# Patient Record
Sex: Female | Born: 1935 | Race: White | Hispanic: No | Smoking: Never smoker
Health system: Southern US, Community
[De-identification: ages and names within clinical notes are randomized; demographics above are authoritative.]

## PROBLEM LIST (undated history)

## (undated) DIAGNOSIS — I639 Cerebral infarction, unspecified: Secondary | ICD-10-CM

## (undated) DIAGNOSIS — B019 Varicella without complication: Secondary | ICD-10-CM

## (undated) DIAGNOSIS — Z8669 Personal history of other diseases of the nervous system and sense organs: Secondary | ICD-10-CM

## (undated) DIAGNOSIS — T7840XA Allergy, unspecified, initial encounter: Secondary | ICD-10-CM

## (undated) DIAGNOSIS — C50919 Malignant neoplasm of unspecified site of unspecified female breast: Secondary | ICD-10-CM

## (undated) DIAGNOSIS — E079 Disorder of thyroid, unspecified: Secondary | ICD-10-CM

## (undated) DIAGNOSIS — F32A Depression, unspecified: Secondary | ICD-10-CM

## (undated) DIAGNOSIS — E785 Hyperlipidemia, unspecified: Secondary | ICD-10-CM

## (undated) DIAGNOSIS — C801 Malignant (primary) neoplasm, unspecified: Secondary | ICD-10-CM

## (undated) DIAGNOSIS — F329 Major depressive disorder, single episode, unspecified: Secondary | ICD-10-CM

## (undated) HISTORY — PX: MASTECTOMY: SHX3

## (undated) HISTORY — DX: Major depressive disorder, single episode, unspecified: F32.9

## (undated) HISTORY — PX: ABDOMINAL HYSTERECTOMY: SHX81

## (undated) HISTORY — DX: Personal history of other diseases of the nervous system and sense organs: Z86.69

## (undated) HISTORY — DX: Varicella without complication: B01.9

## (undated) HISTORY — DX: Cerebral infarction, unspecified: I63.9

## (undated) HISTORY — DX: Allergy, unspecified, initial encounter: T78.40XA

## (undated) HISTORY — DX: Depression, unspecified: F32.A

## (undated) HISTORY — PX: BREAST SURGERY: SHX581

## (undated) HISTORY — DX: Hyperlipidemia, unspecified: E78.5

## (undated) HISTORY — DX: Disorder of thyroid, unspecified: E07.9

## (undated) HISTORY — DX: Malignant (primary) neoplasm, unspecified: C80.1

## (undated) HISTORY — PX: SKIN CANCER DESTRUCTION: SHX778

---

## 1974-10-27 HISTORY — PX: HEMORRHOID SURGERY: SHX153

## 1985-10-27 HISTORY — PX: OTHER SURGICAL HISTORY: SHX169

## 1998-10-27 HISTORY — PX: OTHER SURGICAL HISTORY: SHX169

## 1999-12-24 ENCOUNTER — Encounter: Admission: RE | Admit: 1999-12-24 | Discharge: 1999-12-24 | Payer: Self-pay | Admitting: *Deleted

## 1999-12-24 ENCOUNTER — Encounter: Payer: Self-pay | Admitting: *Deleted

## 2000-03-17 ENCOUNTER — Other Ambulatory Visit: Admission: RE | Admit: 2000-03-17 | Discharge: 2000-03-17 | Payer: Self-pay | Admitting: Obstetrics and Gynecology

## 2000-12-29 ENCOUNTER — Encounter: Admission: RE | Admit: 2000-12-29 | Discharge: 2000-12-29 | Payer: Self-pay | Admitting: Internal Medicine

## 2000-12-29 ENCOUNTER — Encounter: Payer: Self-pay | Admitting: Internal Medicine

## 2002-01-06 ENCOUNTER — Encounter: Admission: RE | Admit: 2002-01-06 | Discharge: 2002-01-06 | Payer: Self-pay | Admitting: *Deleted

## 2002-01-06 ENCOUNTER — Encounter: Payer: Self-pay | Admitting: *Deleted

## 2003-01-18 ENCOUNTER — Encounter: Admission: RE | Admit: 2003-01-18 | Discharge: 2003-01-18 | Payer: Self-pay | Admitting: *Deleted

## 2003-01-18 ENCOUNTER — Encounter: Payer: Self-pay | Admitting: *Deleted

## 2004-01-23 ENCOUNTER — Encounter: Admission: RE | Admit: 2004-01-23 | Discharge: 2004-01-23 | Payer: Self-pay | Admitting: *Deleted

## 2005-01-23 ENCOUNTER — Encounter: Admission: RE | Admit: 2005-01-23 | Discharge: 2005-01-23 | Payer: Self-pay | Admitting: *Deleted

## 2006-01-30 ENCOUNTER — Encounter: Admission: RE | Admit: 2006-01-30 | Discharge: 2006-01-30 | Payer: Self-pay | Admitting: Internal Medicine

## 2007-02-12 ENCOUNTER — Encounter: Admission: RE | Admit: 2007-02-12 | Discharge: 2007-02-12 | Payer: Self-pay | Admitting: *Deleted

## 2010-11-17 ENCOUNTER — Encounter: Payer: Self-pay | Admitting: Internal Medicine

## 2011-03-07 ENCOUNTER — Ambulatory Visit: Payer: Medicare Other | Admitting: Internal Medicine

## 2011-03-14 ENCOUNTER — Ambulatory Visit (INDEPENDENT_AMBULATORY_CARE_PROVIDER_SITE_OTHER): Payer: Medicare Other | Admitting: Internal Medicine

## 2011-03-14 ENCOUNTER — Other Ambulatory Visit: Payer: Self-pay | Admitting: Internal Medicine

## 2011-03-14 DIAGNOSIS — Z1231 Encounter for screening mammogram for malignant neoplasm of breast: Secondary | ICD-10-CM

## 2011-03-14 DIAGNOSIS — F329 Major depressive disorder, single episode, unspecified: Secondary | ICD-10-CM

## 2011-03-14 DIAGNOSIS — E079 Disorder of thyroid, unspecified: Secondary | ICD-10-CM

## 2011-03-14 DIAGNOSIS — E785 Hyperlipidemia, unspecified: Secondary | ICD-10-CM

## 2011-03-17 ENCOUNTER — Encounter: Payer: Self-pay | Admitting: Internal Medicine

## 2011-03-17 DIAGNOSIS — F329 Major depressive disorder, single episode, unspecified: Secondary | ICD-10-CM | POA: Insufficient documentation

## 2011-03-17 DIAGNOSIS — E785 Hyperlipidemia, unspecified: Secondary | ICD-10-CM | POA: Insufficient documentation

## 2011-03-17 DIAGNOSIS — F32A Depression, unspecified: Secondary | ICD-10-CM | POA: Insufficient documentation

## 2011-03-17 DIAGNOSIS — E079 Disorder of thyroid, unspecified: Secondary | ICD-10-CM | POA: Insufficient documentation

## 2011-03-17 NOTE — Progress Notes (Signed)
Subjective:    Patient ID: Sara Forbes, female    DOB: 25-Jun-1932, 77 y.o.   MRN: [id number]  HPI Sara Forbes has recently returned to [place] after living the past several years in [place] and presents to establish for on-going continuity care. She has no specific complaints at today's visit and reports that she has been feeling well.   Past Medical History  Diagnosis Date  . Cancer     CIS right breast  . Varicella w/o complication   . Depression     chronic; never hospitalized, no h/o suicidal ideatino  . Allergy     hayfever  . Hyperlipidemia   . History of migraines     not a current problem  . Thyroid disease   . Hyperparathyroidism   . Stroke     '08 - affected vision - total recovery   Past Surgical History  Procedure Date  . Hemorrhoid surgery 1976  . Breast surgery     right mastectomy with reconstruction 1985  . Bunionectomy 2000    bilateral ([facility])  . Parathyroidectomy 1987    [facility]  . Skin cancer destruction 2008, 2012    face, scalp for squamous cell carcinoma  . Abdominal hysterectomy     fibroids 1978   Family History  Problem Relation Age of Onset  . Arthritis Mother   . Hypertension Mother   . Heart disease Father     CAD/MI  . Hyperlipidemia Father   . Hypertension Father   . Cancer Sister     lung cancer  . Diabetes Neg Hx   . Cancer Sister     breast cancer   History   Social History  . Marital Status: Married    Spouse Name: N/A    Number of Children: N/A  . Years of Education: 16   Occupational History  . homemaker    Social History Main Topics  . Smoking status: Never Smoker   . Smokeless tobacco: Never Used  . Alcohol Use: 3.5 oz/week    7 drink(s) per week  . Drug Use: No  . Sexually Active: Yes -- Female partner(s)   Other Topics Concern  . Not on file   Social History Narrative   [employer] ([employer]).  Married '54.  1 son - '59; 2 dtrs - '61, '71; 6 grandchildren, 1 ggc OTW. Taught school for a  short time. Marriage in good health. End of Life Care:  DNR, no long term intubation, no heroic or futile measures.        Review of Systems Review of Systems  Constitutional:  Negative for fever, chills, activity change and unexpected weight change.  HENT:  Negative for hearing loss, ear pain, congestion, neck stiffness and postnasal drip.   Eyes: Negative for pain, discharge and visual disturbance.  Respiratory: Negative for chest tightness and wheezing.   Cardiovascular: Negative for chest pain and palpitations.       [No decreased exercise tolerance Gastrointestinal: [No change in bowel habit. No bloating or gas. No reflux or indigestion Genitourinary: Negative for urgency, frequency, flank pain and difficulty urinating.  Musculoskeletal: Negative for myalgias, back pain, arthralgias and gait problem.  Neurological: Negative for dizziness, tremors, weakness and headaches.  Hematological: Negative for adenopathy.  Psychiatric/Behavioral: Negative for behavioral problems and dysphoric mood.       Objective:   Physical Exam Vitals reviewed Gen'l: WNWD WW in no distress HEENT - C&S clear, oropharynx without lesions or abnormality Neck - supple,  virtually invisible surgical scar Lungs - normal respiratory function Cor - RRR wihtout murmurs or gallops Abdomen - BS + Extremities - no deformity, no edema Neuro - A&O x 3, CN II-XII grossly intact.        Assessment & Plan:  1. Hypertension - well controlled. She reports that she has had recent labs  Plan- continue present medications.  2. Hyperlipidemia - stable on present medication. She has had recent labs.  3. Depression - has been on zoloft for a long time. She does well with medication and wishes to continue.  4. Health maintenance - will review old records with recommendations to follow.  In summary - a very nice woman who appears medically stable at this time. She will return for follow-up in the next several  months.

## 2011-03-18 ENCOUNTER — Ambulatory Visit
Admission: RE | Admit: 2011-03-18 | Discharge: 2011-03-18 | Disposition: A | Discharge status: Home / Self Care | Payer: Medicare Other | Source: Ambulatory Visit | Attending: Internal Medicine | Admitting: Internal Medicine

## 2011-03-18 DIAGNOSIS — Z1231 Encounter for screening mammogram for malignant neoplasm of breast: Secondary | ICD-10-CM

## 2011-03-21 ENCOUNTER — Other Ambulatory Visit: Payer: Self-pay | Admitting: *Deleted

## 2011-03-21 MED ORDER — LEVOTHYROXINE SODIUM 88 MCG PO TABS: 88.0000 ug | ORAL_TABLET | Freq: Every day | ORAL | Status: DC

## 2011-03-21 MED ORDER — SIMVASTATIN 40 MG PO TABS: 40.0000 mg | ORAL_TABLET | Freq: Every day | ORAL | Status: DC

## 2011-03-21 MED ORDER — SERTRALINE HCL 50 MG PO TABS: 50.0000 mg | ORAL_TABLET | Freq: Every day | ORAL | Status: DC

## 2011-03-21 MED ORDER — TELMISARTAN-HCTZ 80-12.5 MG PO TABS: 1.0000 | ORAL_TABLET | Freq: Every day | ORAL | Status: DC

## 2011-03-21 MED ORDER — ATENOLOL 50 MG PO TABS: 50.0000 mg | ORAL_TABLET | Freq: Two times a day (BID) | ORAL | Status: DC

## 2011-05-20 ENCOUNTER — Encounter: Payer: Self-pay | Admitting: Internal Medicine

## 2011-05-20 ENCOUNTER — Ambulatory Visit (INDEPENDENT_AMBULATORY_CARE_PROVIDER_SITE_OTHER): Payer: Medicare Other | Admitting: Internal Medicine

## 2011-05-20 VITALS — BP 138/68 | HR 58 | Temp 98.8°F | Ht 64.0 in | Wt 141.0 lb

## 2011-05-20 DIAGNOSIS — M5430 Sciatica, unspecified side: Secondary | ICD-10-CM

## 2011-05-20 DIAGNOSIS — M5432 Sciatica, left side: Secondary | ICD-10-CM

## 2011-05-20 MED ORDER — PREDNISONE 10 MG PO TABS: ORAL_TABLET | ORAL | Status: DC

## 2011-05-20 NOTE — Progress Notes (Signed)
Subjective:    Patient ID: Sara Forbes, female    DOB: 01/08/1932, 77 y.o.   MRN: [id number]  HPI  Pt of Dr Debby Bud , here for acute visit,  with c/o signficant 2-3 days episode of left LBP, mild to mod, similar per pt to prior 2 episodes, first occurred 15 yrs ago, recurred 3 yrs ago, tx first time with prednisone, second time with celebrex and improved both times, now with dull and burning quality intermittent at rest and with standing with intermittent radiation to the left buttock and more distal leg laterally to just below the knee, with some numbness occasionaly, no bowel or bladder change, fever, fall.  Only has weakness and "dragging the leg" with ambulation after walking more than about 20 min on the treadmill.  No recent MRI  Also due for right tear duct surgury Friday later this wk, and asked to not take ASA or nsaid prior.  Had dental surgury 2 wks ago to right upper jaw and curretnly still taking doxycyline (almost through). Past Medical History  Diagnosis Date  . Cancer     CIS right breast  . Varicella w/o complication   . Depression     chronic; never hospitalized, no h/o suicidal ideatino  . Allergy     hayfever  . Hyperlipidemia   . History of migraines     not a current problem  . Thyroid disease   . Hyperparathyroidism   . Stroke     '08 - affected vision - total recovery   Past Surgical History  Procedure Date  . Hemorrhoid surgery 1976  . Breast surgery     right mastectomy with reconstruction 1985  . Bunionectomy 2000    bilateral ([facility])  . Parathyroidectomy 1987    [facility]  . Skin cancer destruction 2008, 2012    face, scalp for squamous cell carcinoma  . Abdominal hysterectomy     fibroids 1978    reports that she has never smoked. She has never used smokeless tobacco. She reports that she drinks about 3.5 ounces of alcohol per week. She reports that she does not use illicit drugs. family history includes Arthritis in her mother; Cancer in her  sisters; Heart disease in her father; Hyperlipidemia in her father; and Hypertension in her father and mother.  There is no history of Diabetes. No Known Allergies Current Outpatient Prescriptions on File Prior to Visit  Medication Sig Dispense Refill  . atenolol (TENORMIN) 50 MG tablet Take 1 tablet (50 mg total) by mouth 2 (two) times daily.  180 tablet  3  . ibuprofen (ADVIL,MOTRIN) 100 MG tablet Take 100 mg by mouth 2 (two) times daily.        Marland Kitchen levothyroxine (SYNTHROID, LEVOTHROID) 88 MCG tablet Take 1 tablet (88 mcg total) by mouth daily.  90 tablet  3  . MULTIPLE VITAMIN PO Take by mouth.        . senna (SENOKOT) 8.6 MG tablet Take 1 tablet by mouth at bedtime.        . sertraline (ZOLOFT) 50 MG tablet Take 1 tablet (50 mg total) by mouth daily.  90 tablet  3  . simvastatin (ZOCOR) 40 MG tablet Take 1 tablet (40 mg total) by mouth at bedtime. 1/2 tablet at bedtime  90 tablet  3  . telmisartan-hydrochlorothiazide (MICARDIS HCT) 80-12.5 MG per tablet Take 1 tablet by mouth daily.  90 tablet  3   Review of Systems Review of Systems  Constitutional: Negative  for diaphoresis and unexpected weight change.  HENT: Negative for drooling and tinnitus.   Eyes: Negative for photophobia and visual disturbance.  Respiratory: Negative for choking and stridor.   Gastrointestinal: Negative for vomiting and blood in stool.  Genitourinary: Negative for hematuria and decreased urine volume. .       Objective:   Physical Exam BP 138/68  Pulse 58  Temp(Src) 98.8 F (37.1 C) (Oral)  Ht 5\' 4"  (1.626 m)  Wt 141 lb (63.957 kg)  BMI 24.20 kg/m2  SpO2 96% Physical Exam  VS noted Constitutional: Pt appears well-developed and well-nourished.  HENT: Head: Normocephalic.  Right Ear: External ear normal.  Left Ear: External ear normal.  Eyes: Conjunctivae and EOM are normal. Pupils are equal, round, and reactive to light.  Neck: Normal range of motion. Neck supple.  Cardiovascular: Normal rate and  regular rhythm.   Pulmonary/Chest: Effort normal and breath sounds normal.  Abd:  Soft, NT, non-distended, + BS Neurological: Pt is alert. No cranial nerve deficit. Motor/dtr/gait intact, neg slr on left  Skin: Skin is warm. No erythema.  Psychiatric: Pt behavior is normal. Thought content normal.  Spine nontender, has some left mild paravertebral tender without erythema, rash, swelling       Assessment & Plan:

## 2011-05-20 NOTE — Assessment & Plan Note (Addendum)
Exam benign, but increased symptoms mostly with walking > 20 min on the treadmill recently and occas at rest as well similar to 2 episodes on the past recent 2 -3 wks;  Will tx with predpack, pt declines pain medication, and  to f/u any worsening symptoms or concerns, to avoid nsaids with the prednsisone, and any vigorous stretching excercises or golf until improved, consider MRI if persists or worsens; also advised to hold on tear duct surgury due to prednisone use at this time, though the risk would be small increased

## 2011-05-20 NOTE — Patient Instructions (Addendum)
Take all new medications as prescribed Continue all other medications as before Please avoid stretching excercises and golf until prednisone is completed Please hold on the elective right tear duct surgury until the prednisone is completed, unless otherwise advised per opthamology

## 2011-09-10 ENCOUNTER — Other Ambulatory Visit: Payer: Self-pay | Admitting: Internal Medicine

## 2012-01-05 ENCOUNTER — Other Ambulatory Visit (INDEPENDENT_AMBULATORY_CARE_PROVIDER_SITE_OTHER): Payer: Medicare Other

## 2012-01-05 ENCOUNTER — Ambulatory Visit (INDEPENDENT_AMBULATORY_CARE_PROVIDER_SITE_OTHER): Payer: Medicare Other | Admitting: Internal Medicine

## 2012-01-05 ENCOUNTER — Encounter: Payer: Self-pay | Admitting: Internal Medicine

## 2012-01-05 DIAGNOSIS — E079 Disorder of thyroid, unspecified: Secondary | ICD-10-CM

## 2012-01-05 DIAGNOSIS — E785 Hyperlipidemia, unspecified: Secondary | ICD-10-CM

## 2012-01-05 DIAGNOSIS — M5432 Sciatica, left side: Secondary | ICD-10-CM

## 2012-01-05 DIAGNOSIS — M5430 Sciatica, unspecified side: Secondary | ICD-10-CM

## 2012-01-05 DIAGNOSIS — M899 Disorder of bone, unspecified: Secondary | ICD-10-CM

## 2012-01-05 DIAGNOSIS — M8580 Other specified disorders of bone density and structure, unspecified site: Secondary | ICD-10-CM | POA: Insufficient documentation

## 2012-01-05 LAB — COMPREHENSIVE METABOLIC PANEL
ALT: 22 U/L (ref 0–35)
AST: 23 U/L (ref 0–37)
Albumin: 4.1 g/dL (ref 3.5–5.2)
Calcium: 9.5 mg/dL (ref 8.4–10.5)
Chloride: 103 mEq/L (ref 96–112)
Potassium: 4.6 mEq/L (ref 3.5–5.1)
Sodium: 139 mEq/L (ref 135–145)
Total Protein: 6.8 g/dL (ref 6.0–8.3)

## 2012-01-05 LAB — HEPATIC FUNCTION PANEL
ALT: 22 U/L (ref 0–35)
AST: 23 U/L (ref 0–37)
Total Protein: 6.8 g/dL (ref 6.0–8.3)

## 2012-01-05 LAB — LIPID PANEL
Total CHOL/HDL Ratio: 3
VLDL: 53.2 mg/dL — ABNORMAL HIGH (ref 0.0–40.0)

## 2012-01-05 LAB — LDL CHOLESTEROL, DIRECT: Direct LDL: 112.4 mg/dL

## 2012-01-05 MED ORDER — MELOXICAM 15 MG PO TABS: 15.0000 mg | ORAL_TABLET | Freq: Every day | ORAL | Status: AC

## 2012-01-05 NOTE — Assessment & Plan Note (Signed)
Patient with report of osteopenia  Plan - calcium 1200 mg (diet + supplement)           Vitamin D 1,000 iu daily            Obtain outside DXA report

## 2012-01-05 NOTE — Assessment & Plan Note (Signed)
Lab [facility] reveals adequate control with LDL below goal of 130 or less. Liver functions are normal. She is tolerating medication well  Plan - repeat lab in 1 year

## 2012-01-05 NOTE — Assessment & Plan Note (Signed)
Back exam w/o radicular findings - suspect muscular-skeletal back strain. No indication for imaging.  Plan- conservative treatment with Meloxicam 15 mg once a day - GI precautions given; not to use other NSAIDs.           Curtail straining activities until better = golf holiday           Daily back exercises.

## 2012-01-05 NOTE — Assessment & Plan Note (Signed)
Lab Results  Component Value Date   TSH 1.44 01/05/2012   Good control on present medications - continue the same.

## 2012-01-05 NOTE — Progress Notes (Signed)
Subjective:    Patient ID: Sara Forbes, female    DOB: January 26, 1932, 78 y.o.   MRN: [id number]  HPI Sara Forbes presents for evaluation of back pain. This is a recurrent intermittent problem. She had L-S spine films in [place] and supposedly had a bulging disc. She has been treated with prednisone in the past. She has had a flare of pain as well as having mild pain that radiates to the left leg. She has been able to do all her usual activities including a stretch flex class. No change in bowels, no incontinence.  She is asking for labs for routine follow -up of lipids and thyroid  She has been having nocturnal reflux/heartburn that respondes to otc H2 blocker therapy.  Past Medical History  Diagnosis Date  . Cancer     CIS right breast  . Varicella w/o complication   . Depression     chronic; never hospitalized, no h/o suicidal ideatino  . Allergy     hayfever  . Hyperlipidemia   . History of migraines     not a current problem  . Thyroid disease   . Hyperparathyroidism   . Stroke     '08 - affected vision - total recovery   Past Surgical History  Procedure Date  . Hemorrhoid surgery 1976  . Breast surgery     right mastectomy with reconstruction 1985  . Bunionectomy 2000    bilateral ([facility])  . Parathyroidectomy 1987    [facility]  . Skin cancer destruction 2008, 2012    face, scalp for squamous cell carcinoma  . Abdominal hysterectomy     fibroids 1978   Family History  Problem Relation Age of Onset  . Arthritis Mother   . Hypertension Mother   . Heart disease Father     CAD/MI  . Hyperlipidemia Father   . Hypertension Father   . Cancer Sister     lung cancer  . Diabetes Neg Hx   . Cancer Sister     breast cancer   History   Social History  . Marital Status: Married    Spouse Name: N/A    Number of Children: N/A  . Years of Education: 16   Occupational History  . homemaker    Social History Main Topics  . Smoking status: Never Smoker   .  Smokeless tobacco: Never Used  . Alcohol Use: 3.5 oz/week    7 drink(s) per week  . Drug Use: No  . Sexually Active: Yes -- Female partner(s)   Other Topics Concern  . Not on file   Social History Narrative   [employer] ([employer]).  Married '54.  1 son - '59; 2 dtrs - '61, '71; 6 grandchildren, 1 ggc OTW. Taught school for a short time. Marriage in good health. End of Life Care:  DNR, no long term intubation, no heroic or futile measures.       Review of Systems System review is negative for any constitutional, cardiac, pulmonary, GI or neuro symptoms or complaints other than as described in the HPI.     Objective:   Physical Exam Filed Vitals:   01/05/12 0926  BP: 116/62  Pulse: 66  Temp: 98.3 F (36.8 C)  Resp: 14   Gen'l- wnwd developed well groomed woman in no distress HEENT- C&S clear Cor - RRR Pulm - normal respirations. Back exam: normal stand; normal flex to greater than 100 degrees; normal gait; normal toe/heel walk; normal step up to exam  table; normal SLR sitting; normal DTRs at the patellar tendons; normal sensation to light touch, pin-prick and deep vibratory stimulus; no  CVA tenderness; able to move supine to sitting witout assistance.  Lab Results  Component Value Date   GLUCOSE 93 01/05/2012   CHOL 216* 01/05/2012   TRIG 266.0* 01/05/2012   HDL 65.00 01/05/2012   LDLDIRECT 112.4 01/05/2012        ALT 22 01/05/2012   AST 23 01/05/2012        NA 139 01/05/2012   K 4.6 01/05/2012   CL 103 01/05/2012   CREATININE 0.8 01/05/2012   BUN 25* 01/05/2012   CO2 30 01/05/2012   TSH 1.44 01/05/2012         Assessment & Plan:

## 2012-01-05 NOTE — Patient Instructions (Signed)
Back exam reveals no evidence of a pinched nerve. You may have low back pain that is muscular and you may have mild inflammation that is giving you sciatica type symptoms.   Plan - low back exercises            meloxicam 15 mg - take once a day. Do not take any other NSAIDs, e.g. advil            Vacation from golf until back pain is better.  For reflux  At night - don't eat large meals late; it is ok to take over the counter medications like pepcid.  For osteoporosis 1200 mg calcium, 1000 international units of Vit D.    Sciatica Sciatica is a weakness and/or changes in sensation (tingling, jolts, hot and cold, numbness) along the path the sciatic nerve travels. Irritation or damage to lumbar nerve roots is often also referred to as lumbar radiculopathy.   Lumbar radiculopathy (Sciatica) is the most common form of this problem. Radiculopathy can occur in any of the nerves coming out of the spinal cord. The problems caused depend on which nerves are involved. The sciatic nerve is the large nerve supplying the branches of nerves going from the hip to the toes. It often causes a numbness or weakness in the skin and/or muscles that the sciatic nerve serves. It also may cause symptoms (problems) of pain, burning, tingling, or electric shock-like feelings in the path of this nerve. This usually comes from injury to the fibers that make up the sciatic nerve. Some of these symptoms are low back pain and/or unpleasant feelings in the following areas:  From the mid-buttock down the back of the leg to the back of the knee.   And/or the outside of the calf and top of the foot.   And/or behind the inner ankle to the sole of the foot.  CAUSES    Herniated or slipped disc. Discs are the little cushions between the bones in the back.   Pressure by the piriformis muscle in the buttock on the sciatic nerve (Piriformis Syndrome).   Misalignment of the bones in the lower back and buttocks (Sacroiliac Joint  Derangement).   Narrowing of the spinal canal that puts pressure on or pinches the fibers that make up the sciatic nerve.   A slipped vertebra that is out of line with those above or beneath it.   Abnormality of the nervous system itself so that nerve fibers do not transmit signals properly, especially to feet and calves (neuropathy).   Tumor (this is rare).  Your caregiver can usually determine the cause of your sciatica and begin the treatment most likely to help you. TREATMENT   Taking over-the-counter painkillers, physical therapy, rest, exercise, spinal manipulation, and injections of anesthetics and/or steroids may be used. Surgery, acupuncture, and Yoga can also be effective. Mind over matter techniques, mental imagery, and changing factors such as your bed, chair, desk height, posture, and activities are other treatments that may be helpful. You and your caregiver can help determine what is best for you. With proper diagnosis, the cause of most sciatica can be identified and removed. Communication and cooperation between your caregiver and you is essential. If you are not successful immediately, do not be discouraged. With time, a proper treatment can be found that will make you comfortable. HOME CARE INSTRUCTIONS    If the pain is coming from a problem in the back, applying ice to that area for 15 to 20 minutes,  3 to 4 times per day while awake, may be helpful. Put the ice in a plastic bag. Place a towel between the bag of ice and your skin.   You may exercise or perform your usual activities if these do not aggravate your pain, or as suggested by your caregiver.   Only take over-the-counter or prescription medicines for pain, discomfort, or fever as directed by your caregiver.   If your caregiver has given you a follow-up appointment, it is very important to keep that appointment. Not keeping the appointment could result in a chronic or permanent injury, pain, and disability. If there  is any problem keeping the appointment, you must call back to this facility for assistance.  SEEK IMMEDIATE MEDICAL CARE IF:    You experience loss of control of bowel or bladder.   You have increasing weakness in the trunk, buttocks, or legs.   There is numbness in any areas from the hip down to the toes.   You have difficulty walking or keeping your balance.   You have any of the above, with fever or forceful vomiting.  Document Released: 10/07/2001 Document Revised: 10/02/2011 Document Reviewed: 05/26/2008 [facility] Patient Information 2012 [place], [place].Sciatica Sciatica is a weakness and/or changes in sensation (tingling, jolts, hot and cold, numbness) along the path the sciatic nerve travels. Irritation or damage to lumbar nerve roots is often also referred to as lumbar radiculopathy.   Lumbar radiculopathy (Sciatica) is the most common form of this problem. Radiculopathy can occur in any of the nerves coming out of the spinal cord. The problems caused depend on which nerves are involved. The sciatic nerve is the large nerve supplying the branches of nerves going from the hip to the toes. It often causes a numbness or weakness in the skin and/or muscles that the sciatic nerve serves. It also may cause symptoms (problems) of pain, burning, tingling, or electric shock-like feelings in the path of this nerve. This usually comes from injury to the fibers that make up the sciatic nerve. Some of these symptoms are low back pain and/or unpleasant feelings in the following areas:  From the mid-buttock down the back of the leg to the back of the knee.   And/or the outside of the calf and top of the foot.   And/or behind the inner ankle to the sole of the foot.  CAUSES    Herniated or slipped disc. Discs are the little cushions between the bones in the back.   Pressure by the piriformis muscle in the buttock on the sciatic nerve (Piriformis Syndrome).   Misalignment of the bones in the lower  back and buttocks (Sacroiliac Joint Derangement).   Narrowing of the spinal canal that puts pressure on or pinches the fibers that make up the sciatic nerve.   A slipped vertebra that is out of line with those above or beneath it.   Abnormality of the nervous system itself so that nerve fibers do not transmit signals properly, especially to feet and calves (neuropathy).   Tumor (this is rare).  Your caregiver can usually determine the cause of your sciatica and begin the treatment most likely to help you. TREATMENT   Taking over-the-counter painkillers, physical therapy, rest, exercise, spinal manipulation, and injections of anesthetics and/or steroids may be used. Surgery, acupuncture, and Yoga can also be effective. Mind over matter techniques, mental imagery, and changing factors such as your bed, chair, desk height, posture, and activities are other treatments that may be helpful. You and  your caregiver can help determine what is best for you. With proper diagnosis, the cause of most sciatica can be identified and removed. Communication and cooperation between your caregiver and you is essential. If you are not successful immediately, do not be discouraged. With time, a proper treatment can be found that will make you comfortable. HOME CARE INSTRUCTIONS    If the pain is coming from a problem in the back, applying ice to that area for 15 to 20 minutes, 3 to 4 times per day while awake, may be helpful. Put the ice in a plastic bag. Place a towel between the bag of ice and your skin.   You may exercise or perform your usual activities if these do not aggravate your pain, or as suggested by your caregiver.   Only take over-the-counter or prescription medicines for pain, discomfort, or fever as directed by your caregiver.   If your caregiver has given you a follow-up appointment, it is very important to keep that appointment. Not keeping the appointment could result in a chronic or permanent  injury, pain, and disability. If there is any problem keeping the appointment, you must call back to this facility for assistance.  SEEK IMMEDIATE MEDICAL CARE IF:    You experience loss of control of bowel or bladder.   You have increasing weakness in the trunk, buttocks, or legs.   There is numbness in any areas from the hip down to the toes.   You have difficulty walking or keeping your balance.   You have any of the above, with fever or forceful vomiting.  Document Released: 10/07/2001 Document Revised: 10/02/2011 Document Reviewed: 05/26/2008 [facility] Patient Information 2012 [place], [place].

## 2012-03-01 ENCOUNTER — Other Ambulatory Visit: Payer: Self-pay | Admitting: Internal Medicine

## 2012-03-01 DIAGNOSIS — Z853 Personal history of malignant neoplasm of breast: Secondary | ICD-10-CM

## 2012-03-30 ENCOUNTER — Ambulatory Visit: Payer: Medicare Other

## 2012-04-05 ENCOUNTER — Other Ambulatory Visit: Payer: Self-pay | Admitting: *Deleted

## 2012-04-05 ENCOUNTER — Other Ambulatory Visit: Payer: Self-pay | Admitting: Internal Medicine

## 2012-04-05 MED ORDER — SIMVASTATIN 40 MG PO TABS: ORAL_TABLET | ORAL | Status: DC

## 2012-04-05 NOTE — Telephone Encounter (Signed)
Medication Rx refill sent to  [facility]    [place]

## 2012-04-13 ENCOUNTER — Ambulatory Visit
Admission: RE | Admit: 2012-04-13 | Discharge: 2012-04-13 | Disposition: A | Discharge status: Home / Self Care | Payer: Medicare Other | Source: Ambulatory Visit | Attending: Internal Medicine | Admitting: Internal Medicine

## 2012-04-13 DIAGNOSIS — Z853 Personal history of malignant neoplasm of breast: Secondary | ICD-10-CM

## 2012-05-03 ENCOUNTER — Other Ambulatory Visit: Payer: Self-pay | Admitting: Internal Medicine

## 2012-05-10 ENCOUNTER — Other Ambulatory Visit: Payer: Self-pay | Admitting: Internal Medicine

## 2012-05-26 ENCOUNTER — Telehealth: Payer: Self-pay | Admitting: *Deleted

## 2012-05-26 ENCOUNTER — Encounter: Payer: Self-pay | Admitting: Internal Medicine

## 2012-05-26 NOTE — Telephone Encounter (Signed)
Faxed medication order to [facility],,[facility]

## 2012-06-07 ENCOUNTER — Other Ambulatory Visit: Payer: Self-pay | Admitting: Internal Medicine

## 2012-06-15 ENCOUNTER — Encounter: Payer: Self-pay | Admitting: Internal Medicine

## 2012-06-15 ENCOUNTER — Ambulatory Visit (INDEPENDENT_AMBULATORY_CARE_PROVIDER_SITE_OTHER): Payer: Medicare Other | Admitting: Internal Medicine

## 2012-06-15 VITALS — BP 130/60 | HR 63 | Temp 98.0°F | Resp 16 | Wt 141.0 lb

## 2012-06-15 DIAGNOSIS — M5432 Sciatica, left side: Secondary | ICD-10-CM

## 2012-06-15 DIAGNOSIS — M5430 Sciatica, unspecified side: Secondary | ICD-10-CM

## 2012-06-15 MED ORDER — CELECOXIB 200 MG PO CAPS: 200.0000 mg | ORAL_CAPSULE | Freq: Two times a day (BID) | ORAL | Status: DC

## 2012-06-15 NOTE — Patient Instructions (Addendum)
Low back pain with radiation to the leg as well as some hip pain: suggestive of a flare of degenerative disk disease with possible sciatica - prior x-rays in [place] do demonstrate both disk disease and mild arthritis of the hip.  Plan [email or web address] for self-instruction videos on stretch and exercise   Exercise but listen to your body and stop if it hurts  Referral to Physical therapy at [facility].  Celebrex 200 mg once a day as needed. STOP ALL OTHER ANTI-INFLAMMATORY  DRUGS, E.G. ADVIL, MOBIC, ETC.   Sciatica Sciatica is a weakness and/or changes in sensation (tingling, jolts, hot and cold, numbness) along the path the sciatic nerve travels. Irritation or damage to lumbar nerve roots is often also referred to as lumbar radiculopathy.   Lumbar radiculopathy (Sciatica) is the most common form of this problem. Radiculopathy can occur in any of the nerves coming out of the spinal cord. The problems caused depend on which nerves are involved. The sciatic nerve is the large nerve supplying the branches of nerves going from the hip to the toes. It often causes a numbness or weakness in the skin and/or muscles that the sciatic nerve serves. It also may cause symptoms (problems) of pain, burning, tingling, or electric shock-like feelings in the path of this nerve. This usually comes from injury to the fibers that make up the sciatic nerve. Some of these symptoms are low back pain and/or unpleasant feelings in the following areas:  From the mid-buttock down the back of the leg to the back of the knee.   And/or the outside of the calf and top of the foot.   And/or behind the inner ankle to the sole of the foot.  CAUSES    Herniated or slipped disc. Discs are the little cushions between the bones in the back.   Pressure by the piriformis muscle in the buttock on the sciatic nerve (Piriformis Syndrome).   Misalignment of the bones in the lower back and buttocks (Sacroiliac Joint Derangement).    Narrowing of the spinal canal that puts pressure on or pinches the fibers that make up the sciatic nerve.   A slipped vertebra that is out of line with those above or beneath it.   Abnormality of the nervous system itself so that nerve fibers do not transmit signals properly, especially to feet and calves (neuropathy).   Tumor (this is rare).  Your caregiver can usually determine the cause of your sciatica and begin the treatment most likely to help you. TREATMENT   Taking over-the-counter painkillers, physical therapy, rest, exercise, spinal manipulation, and injections of anesthetics and/or steroids may be used. Surgery, acupuncture, and Yoga can also be effective. Mind over matter techniques, mental imagery, and changing factors such as your bed, chair, desk height, posture, and activities are other treatments that may be helpful. You and your caregiver can help determine what is best for you. With proper diagnosis, the cause of most sciatica can be identified and removed. Communication and cooperation between your caregiver and you is essential. If you are not successful immediately, do not be discouraged. With time, a proper treatment can be found that will make you comfortable. HOME CARE INSTRUCTIONS    If the pain is coming from a problem in the back, applying ice to that area for 15 to 20 minutes, 3 to 4 times per day while awake, may be helpful. Put the ice in a plastic bag. Place a towel between the bag of ice and your skin.  You may exercise or perform your usual activities if these do not aggravate your pain, or as suggested by your caregiver.   Only take over-the-counter or prescription medicines for pain, discomfort, or fever as directed by your caregiver.   If your caregiver has given you a follow-up appointment, it is very important to keep that appointment. Not keeping the appointment could result in a chronic or permanent injury, pain, and disability. If there is any problem  keeping the appointment, you must call back to this facility for assistance.  SEEK IMMEDIATE MEDICAL CARE IF:    You experience loss of control of bowel or bladder.   You have increasing weakness in the trunk, buttocks, or legs.   There is numbness in any areas from the hip down to the toes.   You have difficulty walking or keeping your balance.   You have any of the above, with fever or forceful vomiting.  Document Released: 10/07/2001 Document Revised: 10/02/2011 Document Reviewed: 05/26/2008 [facility] Patient Information 2012 [place], [place].

## 2012-06-16 NOTE — Assessment & Plan Note (Signed)
Review of imaging studies from [place] 12/18/08: L-S spine with indications of DDD and DJD. Hip films with minor changes Increasing symptoms but not dibilitating.  Plan Celebrex 200 mg daily - no other NSAIDs  Refer to PT at [facility]  Referred to [email or web address] for video instruction in stretch exercises.

## 2012-06-16 NOTE — Progress Notes (Signed)
Subjective:    Patient ID: Sara Forbes, female    DOB: 1931/12/04, 78 y.o.   MRN: [id number]  HPI Sara Forbes presents for recommendations in regard to low back pain, predominantly left with some radiation down the leg. She is also having some pain in the right and across her back. She is also having hip pain. She is interested in switching to celebex as her NSAID. She has tried this in the past with good results.  Past Medical History  Diagnosis Date  . Cancer     CIS right breast  . Varicella w/o complication   . Depression     chronic; never hospitalized, no h/o suicidal ideatino  . Allergy     hayfever  . Hyperlipidemia   . History of migraines     not a current problem  . Thyroid disease   . Hyperparathyroidism   . Stroke     '08 - affected vision - total recovery   Past Surgical History  Procedure Date  . Hemorrhoid surgery 1976  . Breast surgery     right mastectomy with reconstruction 1985  . Bunionectomy 2000    bilateral ([facility])  . Parathyroidectomy 1987    [facility]  . Skin cancer destruction 2008, 2012    face, scalp for squamous cell carcinoma  . Abdominal hysterectomy     fibroids 1978   Family History  Problem Relation Age of Onset  . Arthritis Mother   . Hypertension Mother   . Heart disease Father     CAD/MI  . Hyperlipidemia Father   . Hypertension Father   . Cancer Sister     lung cancer  . Diabetes Neg Hx   . Cancer Sister     breast cancer   History   Social History  . Marital Status: Married    Spouse Name: N/A    Number of Children: N/A  . Years of Education: 16   Occupational History  . homemaker    Social History Main Topics  . Smoking status: Never Smoker   . Smokeless tobacco: Never Used  . Alcohol Use: 3.5 oz/week    7 drink(s) per week  . Drug Use: No  . Sexually Active: Yes -- Female partner(s)   Other Topics Concern  . Not on file   Social History Narrative   [employer] ([employer]).  Married '54.  1 son -  '59; 2 dtrs - '61, '71; 6 grandchildren, 1 ggc OTW. Taught school for a short time. Marriage in good health. End of Life Care:  DNR, no long term intubation, no heroic or futile measures.    Current Outpatient Prescriptions on File Prior to Visit  Medication Sig Dispense Refill  . atenolol (TENORMIN) 50 MG tablet TAKE ONE TABLET TWICE DAILY  180 tablet  1  . ibuprofen (ADVIL,MOTRIN) 100 MG tablet Take 100 mg by mouth 2 (two) times daily.        Marland Kitchen levothyroxine (SYNTHROID, LEVOTHROID) 88 MCG tablet TAKE ONE (1) TABLET EACH DAY  90 tablet  1  . meloxicam (MOBIC) 15 MG tablet Take 1 tablet (15 mg total) by mouth daily.  30 tablet  3  . MICARDIS HCT 80-12.5 MG per tablet TAKE ONE (1) TABLET EACH DAY  30 tablet  10  . MULTIPLE VITAMIN PO Take by mouth. Centrum Silver.      . senna (SENOKOT) 8.6 MG tablet Take 1 tablet by mouth at bedtime.        Marland Kitchen  sertraline (ZOLOFT) 50 MG tablet TAKE ONE (1) TABLET EACH DAY  90 tablet  3  . simvastatin (ZOCOR) 40 MG tablet Take  1/2 tablet  45 tablet  5  . predniSONE (DELTASONE) 10 MG tablet 3 tabs by mouth per day for 3 days, 2 tabs per day for 3 days, 1 tab per day for 3 days   18 tablet  0      Review of Systems System review is negative for any constitutional, cardiac, pulmonary, GI or neuro symptoms or complaints other than as described in the HPI.     Objective:   Physical Exam Filed Vitals:   06/15/12 1518  BP: 130/60  Pulse: 63  Temp: 98 F (36.7 C)  Resp: 16   gen'l- WNWD well groomed woman in no distress MSK - able to move about the exam room w/o assistance, able to rise from seated position w/o assistance. Neuor - A&O, MAE.       Assessment & Plan:

## 2012-06-18 ENCOUNTER — Telehealth: Payer: Self-pay

## 2012-06-18 MED ORDER — CELECOXIB 200 MG PO CAPS: 200.0000 mg | ORAL_CAPSULE | Freq: Every day | ORAL | Status: AC

## 2012-06-18 NOTE — Telephone Encounter (Signed)
Pt called requesting clarification on Celebrex Rx - pt says she was advised by MD to take 1 tab QD but instructions on bottle says 1 po BID, please clarify.

## 2012-06-18 NOTE — Telephone Encounter (Signed)
Should be once a day. Please adjust Med Rec

## 2012-06-18 NOTE — Telephone Encounter (Signed)
Med list, pharmacy and pt advised of update.

## 2012-10-14 ENCOUNTER — Telehealth: Payer: Self-pay | Admitting: *Deleted

## 2012-10-14 NOTE — Telephone Encounter (Signed)
OK for OV to discuss symptoms and management

## 2012-10-14 NOTE — Telephone Encounter (Signed)
Patient calling concerning her sciatica and leg discomfort. She is currently taking Celebrex 200mg  once daily.  (if is prescribed BID but doesn't want to stay on Celebrex). Her sis ter currently has the same issues that she ha and was placed on Neurontin/Gabapentin.  She is wanting to know is this an alternative for her instead of Celebrex. Advised I would forward her concerns regarding medication to the physician. Understanding expressed.Bonita Quin may call her on her cell phone [phone] and leave a message.

## 2012-10-14 NOTE — Telephone Encounter (Signed)
Pt is concerned about side effects from Celebrex and she wants to know if Gabapentin would help with her leg pain instead.

## 2012-10-15 ENCOUNTER — Other Ambulatory Visit: Payer: Self-pay | Admitting: Internal Medicine

## 2013-02-04 ENCOUNTER — Other Ambulatory Visit: Payer: Self-pay | Admitting: Internal Medicine

## 2013-02-08 ENCOUNTER — Other Ambulatory Visit: Payer: Self-pay | Admitting: Internal Medicine

## 2013-02-08 DIAGNOSIS — Z139 Encounter for screening, unspecified: Secondary | ICD-10-CM

## 2013-04-19 ENCOUNTER — Ambulatory Visit (INDEPENDENT_AMBULATORY_CARE_PROVIDER_SITE_OTHER): Payer: Medicare Other

## 2013-04-19 ENCOUNTER — Other Ambulatory Visit: Payer: Self-pay | Admitting: Internal Medicine

## 2013-04-19 DIAGNOSIS — Z139 Encounter for screening, unspecified: Secondary | ICD-10-CM

## 2013-04-19 DIAGNOSIS — Z1231 Encounter for screening mammogram for malignant neoplasm of breast: Secondary | ICD-10-CM

## 2013-04-25 ENCOUNTER — Other Ambulatory Visit: Payer: Self-pay | Admitting: Internal Medicine

## 2013-05-09 ENCOUNTER — Other Ambulatory Visit: Payer: Self-pay | Admitting: Internal Medicine

## 2013-06-06 ENCOUNTER — Other Ambulatory Visit: Payer: Self-pay | Admitting: Internal Medicine

## 2013-07-25 ENCOUNTER — Other Ambulatory Visit: Payer: Self-pay | Admitting: Internal Medicine

## 2013-11-01 ENCOUNTER — Other Ambulatory Visit: Payer: Self-pay | Admitting: Internal Medicine

## 2014-04-03 ENCOUNTER — Other Ambulatory Visit: Payer: Self-pay | Admitting: Internal Medicine

## 2014-04-03 DIAGNOSIS — Z1231 Encounter for screening mammogram for malignant neoplasm of breast: Secondary | ICD-10-CM

## 2014-04-25 ENCOUNTER — Ambulatory Visit (INDEPENDENT_AMBULATORY_CARE_PROVIDER_SITE_OTHER): Payer: Medicare Other

## 2014-04-25 DIAGNOSIS — Z1231 Encounter for screening mammogram for malignant neoplasm of breast: Secondary | ICD-10-CM

## 2015-06-12 ENCOUNTER — Other Ambulatory Visit: Payer: Self-pay | Admitting: Internal Medicine

## 2015-06-12 DIAGNOSIS — Z1231 Encounter for screening mammogram for malignant neoplasm of breast: Secondary | ICD-10-CM

## 2015-06-21 ENCOUNTER — Ambulatory Visit (INDEPENDENT_AMBULATORY_CARE_PROVIDER_SITE_OTHER): Payer: Medicare Other

## 2015-06-21 DIAGNOSIS — Z1231 Encounter for screening mammogram for malignant neoplasm of breast: Secondary | ICD-10-CM | POA: Diagnosis not present

## 2015-10-28 HISTORY — PX: BREAST BIOPSY: SHX20

## 2016-05-26 ENCOUNTER — Other Ambulatory Visit: Payer: Self-pay | Admitting: Nurse Practitioner

## 2016-05-26 DIAGNOSIS — Z1231 Encounter for screening mammogram for malignant neoplasm of breast: Secondary | ICD-10-CM

## 2016-06-25 ENCOUNTER — Ambulatory Visit (INDEPENDENT_AMBULATORY_CARE_PROVIDER_SITE_OTHER): Payer: Medicare Other

## 2016-06-25 DIAGNOSIS — Z1231 Encounter for screening mammogram for malignant neoplasm of breast: Secondary | ICD-10-CM | POA: Diagnosis not present

## 2016-07-03 ENCOUNTER — Other Ambulatory Visit: Payer: Self-pay | Admitting: Nurse Practitioner

## 2016-07-03 DIAGNOSIS — R928 Other abnormal and inconclusive findings on diagnostic imaging of breast: Secondary | ICD-10-CM

## 2016-07-09 ENCOUNTER — Other Ambulatory Visit: Payer: Self-pay | Admitting: Nurse Practitioner

## 2016-07-09 ENCOUNTER — Ambulatory Visit
Admission: RE | Admit: 2016-07-09 | Discharge: 2016-07-09 | Disposition: A | Discharge status: Home / Self Care | Payer: Medicare Other | Source: Ambulatory Visit | Attending: Nurse Practitioner | Admitting: Nurse Practitioner

## 2016-07-09 DIAGNOSIS — R928 Other abnormal and inconclusive findings on diagnostic imaging of breast: Secondary | ICD-10-CM

## 2016-07-09 DIAGNOSIS — N6320 Unspecified lump in the left breast, unspecified quadrant: Secondary | ICD-10-CM

## 2016-07-15 ENCOUNTER — Ambulatory Visit
Admission: RE | Admit: 2016-07-15 | Discharge: 2016-07-15 | Disposition: A | Discharge status: Home / Self Care | Payer: Medicare Other | Source: Ambulatory Visit | Attending: Nurse Practitioner | Admitting: Nurse Practitioner

## 2016-07-15 ENCOUNTER — Other Ambulatory Visit: Payer: Self-pay | Admitting: Nurse Practitioner

## 2016-07-15 DIAGNOSIS — N6320 Unspecified lump in the left breast, unspecified quadrant: Secondary | ICD-10-CM

## 2017-07-06 ENCOUNTER — Other Ambulatory Visit: Payer: Self-pay | Admitting: Nurse Practitioner

## 2017-07-06 DIAGNOSIS — Z1231 Encounter for screening mammogram for malignant neoplasm of breast: Secondary | ICD-10-CM

## 2017-07-22 ENCOUNTER — Ambulatory Visit
Admission: RE | Admit: 2017-07-22 | Discharge: 2017-07-22 | Disposition: A | Discharge status: Home / Self Care | Payer: Medicare Other | Source: Ambulatory Visit | Attending: Nurse Practitioner | Admitting: Nurse Practitioner

## 2017-07-22 DIAGNOSIS — Z1231 Encounter for screening mammogram for malignant neoplasm of breast: Secondary | ICD-10-CM

## 2017-07-22 HISTORY — DX: Malignant neoplasm of unspecified site of unspecified female breast: C50.919

## 2018-07-05 ENCOUNTER — Other Ambulatory Visit: Payer: Self-pay | Admitting: Nurse Practitioner

## 2018-07-05 DIAGNOSIS — Z1231 Encounter for screening mammogram for malignant neoplasm of breast: Secondary | ICD-10-CM

## 2018-08-04 ENCOUNTER — Ambulatory Visit: Payer: Medicare Other

## 2018-09-15 ENCOUNTER — Ambulatory Visit
Admission: RE | Admit: 2018-09-15 | Discharge: 2018-09-15 | Disposition: A | Discharge status: Home / Self Care | Payer: Medicare Other | Source: Ambulatory Visit | Attending: Nurse Practitioner | Admitting: Nurse Practitioner

## 2018-09-15 DIAGNOSIS — Z1231 Encounter for screening mammogram for malignant neoplasm of breast: Secondary | ICD-10-CM

## 2018-09-17 ENCOUNTER — Other Ambulatory Visit: Payer: Self-pay | Admitting: Nurse Practitioner

## 2018-09-17 DIAGNOSIS — R928 Other abnormal and inconclusive findings on diagnostic imaging of breast: Secondary | ICD-10-CM

## 2018-09-22 ENCOUNTER — Ambulatory Visit
Admission: RE | Admit: 2018-09-22 | Discharge: 2018-09-22 | Disposition: A | Discharge status: Home / Self Care | Payer: Medicare Other | Source: Ambulatory Visit | Attending: Nurse Practitioner | Admitting: Nurse Practitioner

## 2018-09-22 DIAGNOSIS — R928 Other abnormal and inconclusive findings on diagnostic imaging of breast: Secondary | ICD-10-CM

## 2019-08-22 ENCOUNTER — Other Ambulatory Visit: Payer: Self-pay | Admitting: Internal Medicine

## 2019-08-22 DIAGNOSIS — Z1231 Encounter for screening mammogram for malignant neoplasm of breast: Secondary | ICD-10-CM

## 2019-10-12 ENCOUNTER — Ambulatory Visit: Payer: Medicare Other

## 2019-11-05 ENCOUNTER — Emergency Department (HOSPITAL_BASED_OUTPATIENT_CLINIC_OR_DEPARTMENT_OTHER): Payer: Medicare Other

## 2019-11-05 ENCOUNTER — Encounter (HOSPITAL_BASED_OUTPATIENT_CLINIC_OR_DEPARTMENT_OTHER): Payer: Self-pay | Admitting: Emergency Medicine

## 2019-11-05 ENCOUNTER — Emergency Department (HOSPITAL_BASED_OUTPATIENT_CLINIC_OR_DEPARTMENT_OTHER)
Admission: EM | Admit: 2019-11-05 | Discharge: 2019-11-05 | Disposition: A | Discharge status: Home / Self Care | Payer: Medicare Other | Attending: Emergency Medicine | Admitting: Emergency Medicine

## 2019-11-05 ENCOUNTER — Other Ambulatory Visit: Payer: Self-pay

## 2019-11-05 DIAGNOSIS — I10 Essential (primary) hypertension: Secondary | ICD-10-CM | POA: Insufficient documentation

## 2019-11-05 DIAGNOSIS — R410 Disorientation, unspecified: Secondary | ICD-10-CM | POA: Diagnosis not present

## 2019-11-05 DIAGNOSIS — Z79899 Other long term (current) drug therapy: Secondary | ICD-10-CM | POA: Insufficient documentation

## 2019-11-05 LAB — CBC WITH DIFFERENTIAL/PLATELET
Abs Immature Granulocytes: 0.04 10*3/uL (ref 0.00–0.07)
Basophils Absolute: 0.1 10*3/uL (ref 0.0–0.1)
Basophils Relative: 1 %
Eosinophils Absolute: 0.2 10*3/uL (ref 0.0–0.5)
Eosinophils Relative: 2 %
HCT: 40.2 % (ref 36.0–46.0)
Hemoglobin: 13.6 g/dL (ref 12.0–15.0)
Immature Granulocytes: 1 %
Lymphocytes Relative: 16 %
Lymphs Abs: 1.1 10*3/uL (ref 0.7–4.0)
MCH: 34.5 pg — ABNORMAL HIGH (ref 26.0–34.0)
MCHC: 33.8 g/dL (ref 30.0–36.0)
MCV: 102 fL — ABNORMAL HIGH (ref 80.0–100.0)
Monocytes Absolute: 0.6 10*3/uL (ref 0.1–1.0)
Monocytes Relative: 9 %
Neutro Abs: 4.8 10*3/uL (ref 1.7–7.7)
Neutrophils Relative %: 71 %
Platelets: 189 10*3/uL (ref 150–400)
RBC: 3.94 MIL/uL (ref 3.87–5.11)
RDW: 13 % (ref 11.5–15.5)
WBC: 6.7 10*3/uL (ref 4.0–10.5)
nRBC: 0 % (ref 0.0–0.2)

## 2019-11-05 LAB — URINALYSIS, ROUTINE W REFLEX MICROSCOPIC
Bilirubin Urine: NEGATIVE
Glucose, UA: NEGATIVE mg/dL
Hgb urine dipstick: NEGATIVE
Ketones, ur: NEGATIVE mg/dL
Leukocytes,Ua: NEGATIVE
Nitrite: NEGATIVE
Protein, ur: NEGATIVE mg/dL
Specific Gravity, Urine: 1.01 (ref 1.005–1.030)
pH: 6 (ref 5.0–8.0)

## 2019-11-05 LAB — COMPREHENSIVE METABOLIC PANEL
ALT: 21 U/L (ref 0–44)
AST: 24 U/L (ref 15–41)
Albumin: 3.9 g/dL (ref 3.5–5.0)
Alkaline Phosphatase: 83 U/L (ref 38–126)
Anion gap: 10 (ref 5–15)
BUN: 19 mg/dL (ref 8–23)
CO2: 25 mmol/L (ref 22–32)
Calcium: 9.1 mg/dL (ref 8.9–10.3)
Chloride: 101 mmol/L (ref 98–111)
Creatinine, Ser: 0.93 mg/dL (ref 0.44–1.00)
GFR calc Af Amer: >60 mL/min (ref >60)
GFR calc non Af Amer: 56 mL/min — ABNORMAL LOW (ref >60)
Glucose, Bld: 134 mg/dL — ABNORMAL HIGH (ref 70–99)
Potassium: 4 mmol/L (ref 3.5–5.1)
Sodium: 136 mmol/L (ref 135–145)
Total Bilirubin: 0.7 mg/dL (ref 0.3–1.2)
Total Protein: 6.6 g/dL (ref 6.5–8.1)

## 2019-11-05 LAB — CBG MONITORING, ED: Glucose-Capillary: 119 mg/dL — ABNORMAL HIGH (ref 70–99)

## 2019-11-05 MED ORDER — SODIUM CHLORIDE 0.9 % IV SOLN: INTRAVENOUS | Status: DC

## 2019-11-05 NOTE — ED Notes (Signed)
Patient transported to CT 

## 2019-11-05 NOTE — ED Provider Notes (Signed)
[facility] EMERGENCY DEPARTMENT Provider Note   CSN: [id number] Arrival date & time: 11/05/19  2047     History Chief Complaint  Patient presents with  . Hypertension    Sara Forbes is a 86 y.o. female.  Pt presents to the ED today with confusion and elevated bp.  The pt said she was visiting with her daughter today and became acutely confused.  The pt's daughter checked her bp and it was elevated.  Pt said she rarely checks her bp b/c it's well controlled on her bp meds.  The pt feels back to nl now.  She has been fine until tonight.  She denied any difficulty walking/talking/swallowing.  No weakness or numbness in her arms or legs.        Past Medical History:  Diagnosis Date  . Allergy    hayfever  . Breast cancer ([place])   . Cancer [facility])    CIS right breast  . Depression    chronic; never hospitalized, no h/o suicidal ideatino  . History of migraines    not a current problem  . Hyperlipidemia   . Hyperparathyroidism   . Stroke [facility])    '08 - affected vision - total recovery  . Thyroid disease   . Varicella w/o complication     Patient Active Problem List   Diagnosis Date Noted  . Osteopenia 01/05/2012  . Sciatica of left side 05/20/2011  . Depression   . Hyperlipidemia   . Thyroid disease     Past Surgical History:  Procedure Laterality Date  . ABDOMINAL HYSTERECTOMY     fibroids 1978  . BREAST BIOPSY Left 2017  . BREAST SURGERY     right mastectomy with reconstruction 1985  . bunionectomy  2000   bilateral ([facility])  . [place]  . MASTECTOMY Right   . parathyroidectomy  1987   [facility]  . SKIN CANCER DESTRUCTION  2008, 2012   face, scalp for squamous cell carcinoma     OB History   No obstetric history on file.     Family History  Problem Relation Age of Onset  . Arthritis Mother   . Hypertension Mother   . Heart disease Father        CAD/MI  . Hyperlipidemia Father   . Hypertension Father   . Cancer  Sister        lung cancer  . Cancer Sister        breast cancer  . Diabetes Neg Hx     Social History   Tobacco Use  . Smoking status: Never Smoker  . Smokeless tobacco: Never Used  Substance Use Topics  . Alcohol use: Yes    Alcohol/week: 7.0 standard drinks    Types: 7 drink(s) per week  . Drug use: No    Home Medications Prior to Admission medications   Medication Sig Start Date End Date Taking? Authorizing Provider  atenolol (TENORMIN) 50 MG tablet TAKE ONE (1) TABLET BY MOUTH TWO (2) TIMES DAILY 06/06/13   Norins, Heinz Knuckles, MD  CELEBREX 200 MG capsule TAKE ONE (1) CAPSULE BY MOUTH 2 TIMES   DAILY 10/15/12   Norins, Heinz Knuckles, MD  ibuprofen (ADVIL,MOTRIN) 100 MG tablet Take 100 mg by mouth 2 (two) times daily.      [provider]  levothyroxine (SYNTHROID, LEVOTHROID) 88 MCG tablet TAKE ONE (1) TABLET EACH DAY 05/09/13   Norins, Heinz Knuckles, MD  MICARDIS HCT 80-12.5 MG per tablet  TAKE ONE (1) TABLET EACH DAY 06/07/12   Norins, Heinz Knuckles, MD  MULTIPLE VITAMIN PO Take by mouth. Centrum Silver.    [provider]  predniSONE (DELTASONE) 10 MG tablet 3 tabs by mouth per day for 3 days, 2 tabs per day for 3 days, 1 tab per day for 3 days  05/20/11   Biagio Borg, MD  senna (SENOKOT) 8.6 MG tablet Take 1 tablet by mouth at bedtime.      [provider]  sertraline (ZOLOFT) 50 MG tablet TAKE ONE (1) TABLET EACH DAY 06/07/12   Norins, Heinz Knuckles, MD  simvastatin (ZOCOR) 40 MG tablet TAKE 1/2 TABLET AT BEDTIME 11/01/13   Norins, Heinz Knuckles, MD  tobramycin-dexamethasone [facility]) ophthalmic solution Place 1 drop into the right eye 4 (four) times daily.    [provider]    Allergies    Patient has no known allergies.  Review of Systems   Review of Systems  Neurological:       Confusion  All other systems reviewed and are negative.   Physical Exam Updated Vital Signs BP (!) 173/73   Pulse 76   Temp 98 F (36.7 C) (Oral)   Resp 16   Ht 5\' 4"   (1.626 m)   Wt 63.5 kg   SpO2 100%   BMI 24.03 kg/m   Physical Exam Vitals and nursing note reviewed.  Constitutional:      Appearance: Normal appearance.  HENT:     Head: Normocephalic and atraumatic.     Right Ear: External ear normal.     Left Ear: External ear normal.     Nose: Nose normal.     Mouth/Throat:     Mouth: Mucous membranes are moist.     Pharynx: Oropharynx is clear.  Eyes:     Extraocular Movements: Extraocular movements intact.     Conjunctiva/sclera: Conjunctivae normal.     Pupils: Pupils are equal, round, and reactive to light.  Cardiovascular:     Rate and Rhythm: Normal rate and regular rhythm.     Pulses: Normal pulses.     Heart sounds: Normal heart sounds.  Pulmonary:     Effort: Pulmonary effort is normal.     Breath sounds: Normal breath sounds.  Abdominal:     General: Abdomen is flat. Bowel sounds are normal.     Palpations: Abdomen is soft.  Musculoskeletal:        General: Normal range of motion.     Cervical back: Normal range of motion and neck supple.  Skin:    General: Skin is warm.     Capillary Refill: Capillary refill takes less than 2 seconds.  Neurological:     General: No focal deficit present.     Mental Status: She is alert and oriented to person, place, and time.  Psychiatric:        Mood and Affect: Mood normal.        Behavior: Behavior normal.        Thought Content: Thought content normal.        Judgment: Judgment normal.     ED Results / Procedures / Treatments   Labs (all labs ordered are listed, but only abnormal results are displayed) Labs Reviewed  COMPREHENSIVE METABOLIC PANEL - Abnormal; Notable for the following components:      Result Value   Glucose, Bld 134 (*)    GFR calc non Af Amer 56 (*)    All other components within normal  limits  CBC WITH DIFFERENTIAL/PLATELET - Abnormal; Notable for the following components:   MCV 102.0 (*)    MCH 34.5 (*)    All other components within normal limits    CBG MONITORING, ED - Abnormal; Notable for the following components:   Glucose-Capillary 119 (*)    All other components within normal limits  URINALYSIS, ROUTINE W REFLEX MICROSCOPIC    EKG EKG Interpretation  Date/Time:  Saturday November 05 2019 21:24:54 EST Ventricular Rate:  66 PR Interval:    QRS Duration: 88 QT Interval:  425 QTC Calculation: 446 R Axis:   6 Text Interpretation: Sinus rhythm Minimal ST depression, anterior leads Baseline wander in lead(s) V2 V6 Confirmed by Isla Pence [phone]) on 11/05/2019 9:27:07 PM   Radiology DG Chest 2 View  Result Date: 11/05/2019 CLINICAL DATA:  Hypertension, dizziness EXAM: CHEST - 2 VIEW COMPARISON:  None. FINDINGS: Heart and mediastinal contours are within normal limits. No focal opacities or effusions. No acute bony abnormality. IMPRESSION: No active cardiopulmonary disease. Electronically Signed   By: Rolm Baptise M.D.   On: 11/05/2019 22:15   CT HEAD WO CONTRAST  Result Date: 11/05/2019 CLINICAL DATA:  Periodic confusion EXAM: CT HEAD WITHOUT CONTRAST TECHNIQUE: Contiguous axial images were obtained from the base of the skull through the vertex without intravenous contrast. COMPARISON:  None. FINDINGS: Brain: No evidence of acute territorial infarction, hemorrhage, hydrocephalus,extra-axial collection or mass lesion/mass effect. There is dilatation the ventricles and sulci consistent with age-related atrophy. Low-attenuation changes in the deep white matter consistent with small vessel ischemia. Vascular: No hyperdense vessel or unexpected calcification. Skull: The skull is intact. No fracture or focal lesion identified. Sinuses/Orbits: The visualized paranasal sinuses and mastoid air cells are clear. The orbits and globes intact. Other: None IMPRESSION: No acute intracranial abnormality. Findings consistent with age related atrophy and chronic small vessel ischemia Electronically Signed   By: Prudencio Pair M.D.   On: 11/05/2019 23:25     Procedures Procedures (including critical care time)  Medications Ordered in ED Medications  0.9 %  sodium chloride infusion ( Intravenous New Bag/Given 11/05/19 2305)    ED Course  I have reviewed the triage vital signs and the nursing notes.  Pertinent labs & imaging results that were available during my care of the patient were reviewed by me and considered in my medical decision making (see chart for details).    MDM Rules/Calculators/A&P                      Pt is neurologically intact here.  Work up is reassuring.  SBP down to 140s.  The pt is encouraged to check her bp daily and call her pcp if her bp remains elevated.  Return if worse.  Final Clinical Impression(s) / ED Diagnoses Final diagnoses:  Essential hypertension    Rx / DC Orders ED Discharge Orders    None       Isla Pence, MD 11/05/19 2337

## 2019-11-05 NOTE — Discharge Instructions (Addendum)
Check your bp daily and keep a log of your blood pressures.  Please let your pcp know if they continue to remain elevated.

## 2019-11-05 NOTE — ED Triage Notes (Signed)
Pt has been confused intermittantly since 1530 today after visiting with daughter. Daughter checked BP 200/80 at 1800. Pt has been taking all of normal HTN meds. No Covid exposure and travel.

## 2019-11-30 ENCOUNTER — Ambulatory Visit
Admission: RE | Admit: 2019-11-30 | Discharge: 2019-11-30 | Disposition: A | Discharge status: Home / Self Care | Payer: Medicare Other | Source: Ambulatory Visit | Attending: Internal Medicine | Admitting: Internal Medicine

## 2019-11-30 ENCOUNTER — Other Ambulatory Visit: Payer: Self-pay

## 2019-11-30 DIAGNOSIS — Z1231 Encounter for screening mammogram for malignant neoplasm of breast: Secondary | ICD-10-CM

## 2020-06-11 IMAGING — MG DIGITAL SCREENING UNILAT LEFT W/ TOMO W/ CAD
4 series · 4 of 12 positions shown · non-contrast
Comparison: Previous exam(s).

ACR Breast Density Category a: The breast tissue is almost entirely
fatty.

CLINICAL DATA: Screening.

EXAM:
DIGITAL SCREENING UNILATERAL LEFT MAMMOGRAM WITH CAD AND TOMO

[L MLO synth-2D]
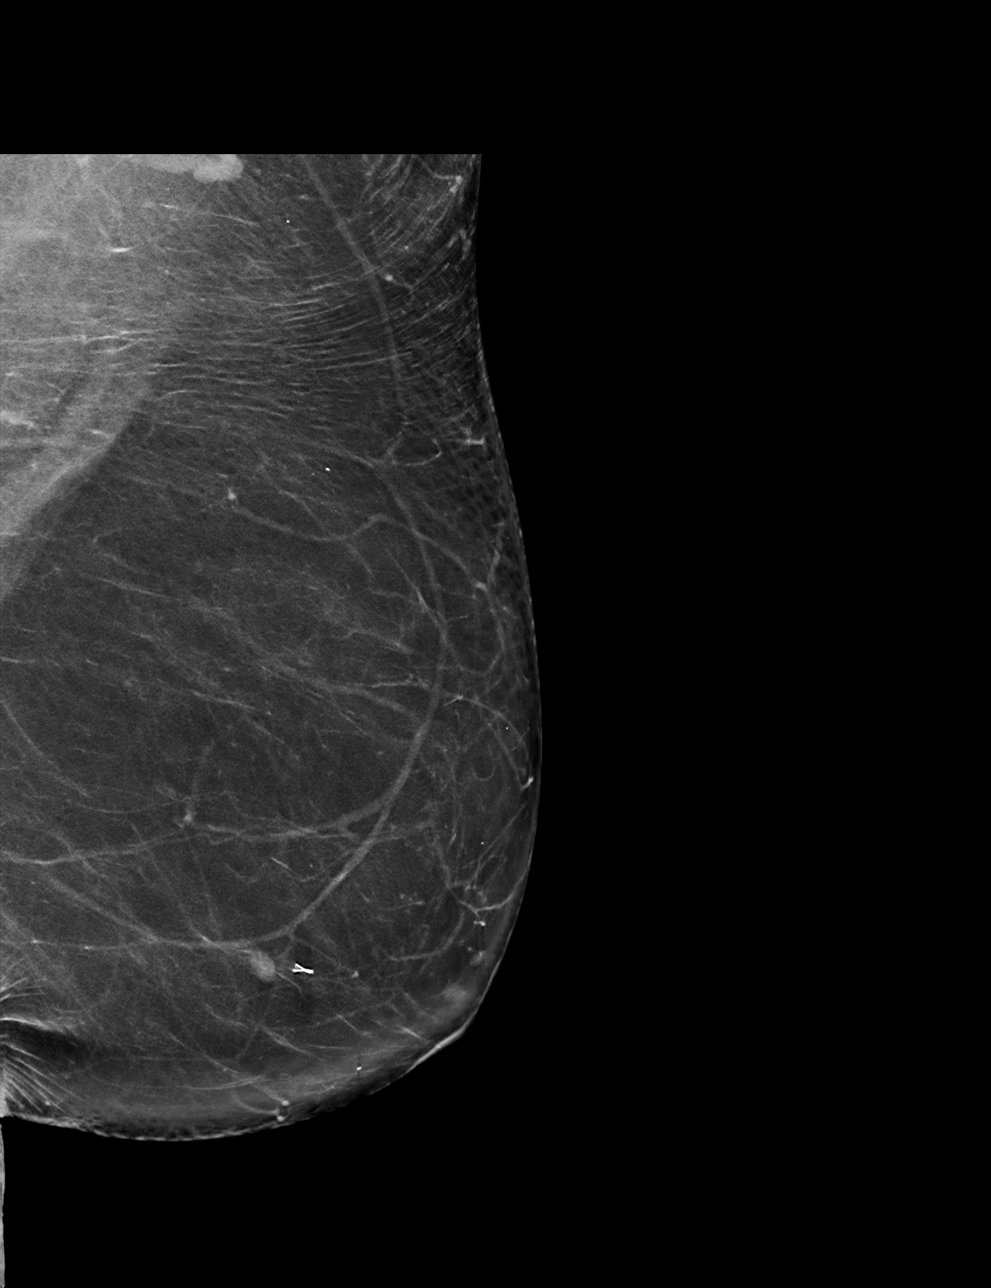

[L CC synth-2D]
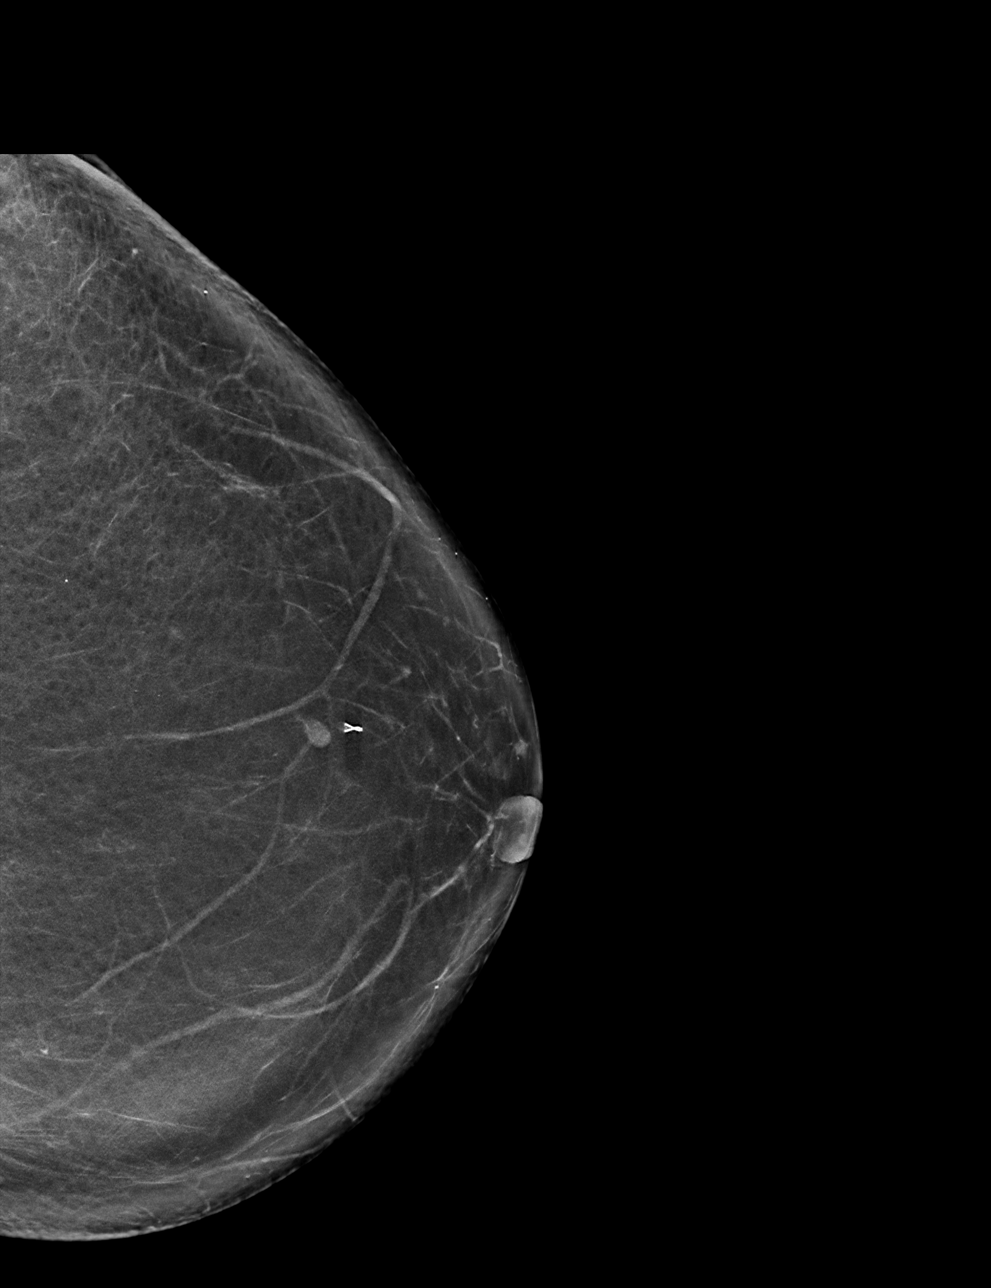

[L CC tomo · tomo slice 36/71.0]
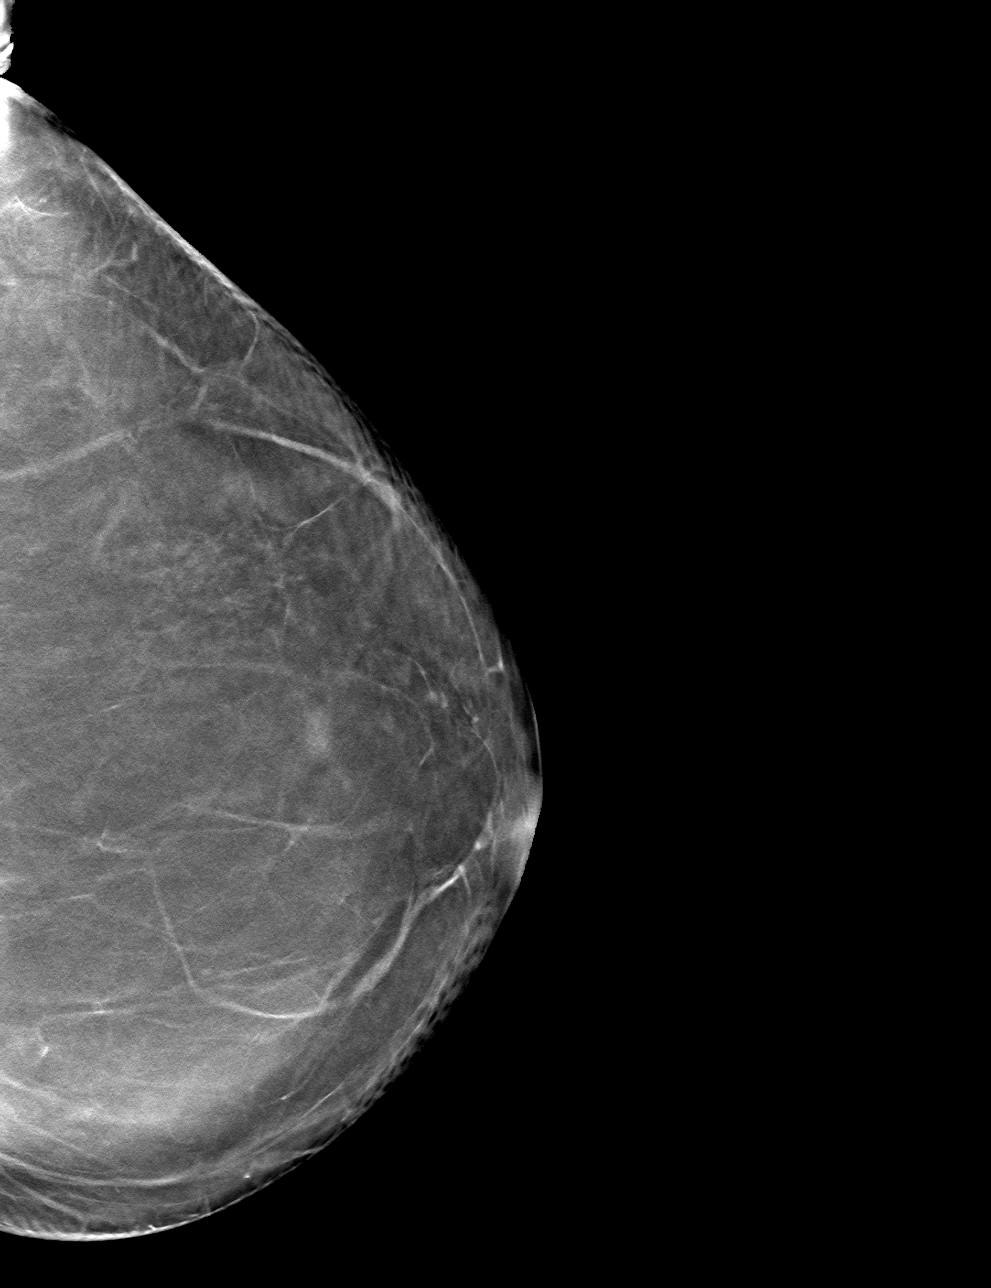

[L MLO tomo · tomo slice 40/79.0]
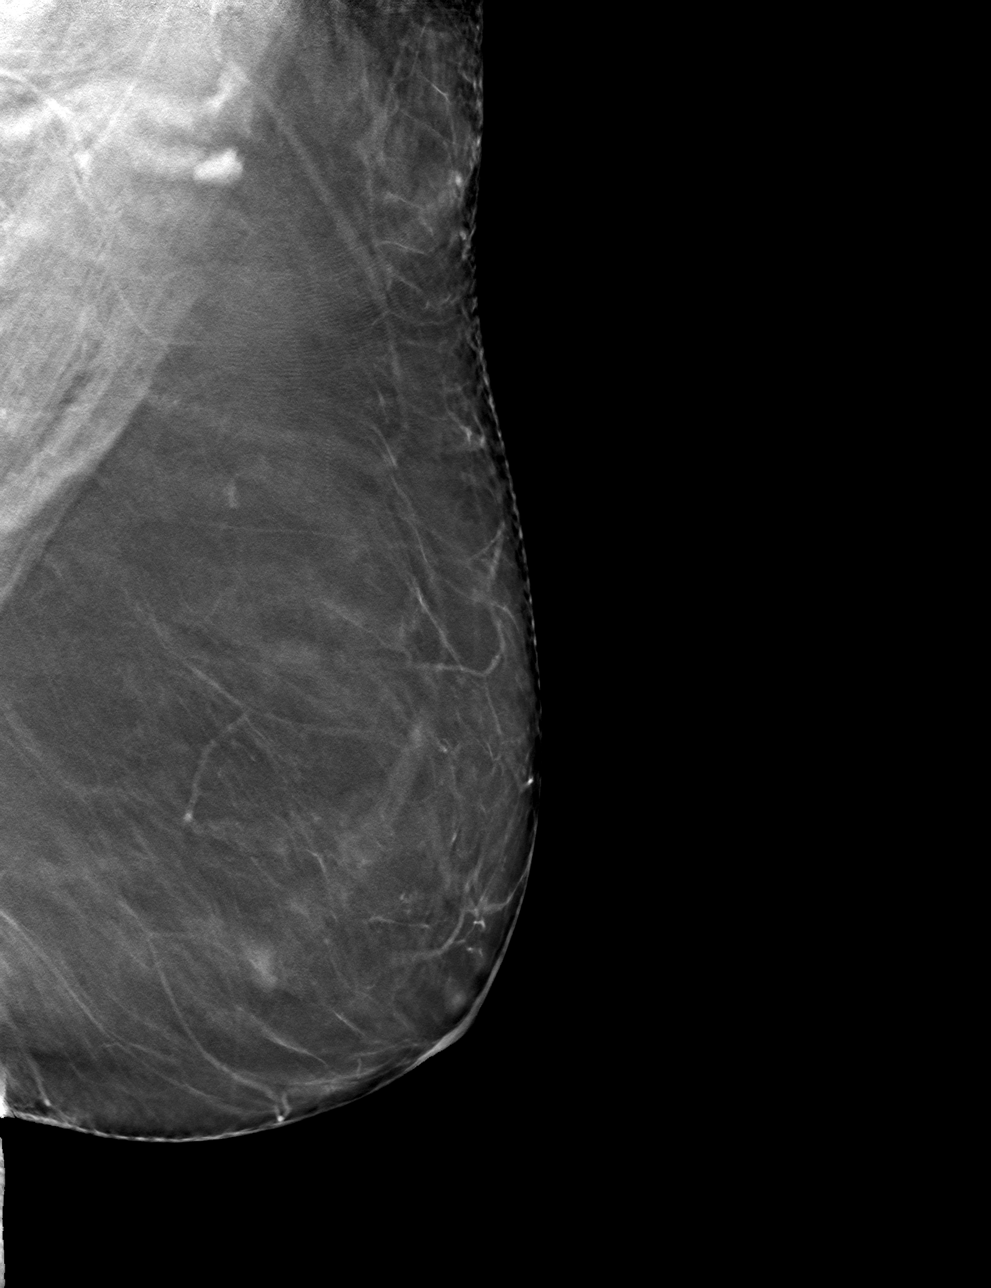

[4 of 12 positions shown; findings below may reference images not displayed]

FINDINGS: The patient has had a right mastectomy. There are no findings
suspicious for malignancy.

Images were processed with CAD.
IMPRESSION: No mammographic evidence of malignancy. A result letter of this
screening mammogram will be mailed directly to the patient.

RECOMMENDATION:
Screening mammogram in one year.  (Code:1J-1-PBJ)

BI-RADS CATEGORY  1: Negative.

## 2020-10-31 ENCOUNTER — Other Ambulatory Visit: Payer: Self-pay | Admitting: Internal Medicine

## 2020-10-31 DIAGNOSIS — Z1231 Encounter for screening mammogram for malignant neoplasm of breast: Secondary | ICD-10-CM

## 2020-12-12 ENCOUNTER — Ambulatory Visit
Admission: RE | Admit: 2020-12-12 | Discharge: 2020-12-12 | Disposition: A | Discharge status: Home / Self Care | Payer: Medicare Other | Source: Ambulatory Visit | Attending: Internal Medicine | Admitting: Internal Medicine

## 2020-12-12 ENCOUNTER — Other Ambulatory Visit: Payer: Self-pay

## 2020-12-12 DIAGNOSIS — Z1231 Encounter for screening mammogram for malignant neoplasm of breast: Secondary | ICD-10-CM

## 2021-02-17 ENCOUNTER — Emergency Department (HOSPITAL_BASED_OUTPATIENT_CLINIC_OR_DEPARTMENT_OTHER)
Admission: EM | Admit: 2021-02-17 | Discharge: 2021-02-17 | Disposition: A | Discharge status: Home / Self Care | Payer: Medicare Other | Attending: Emergency Medicine | Admitting: Emergency Medicine

## 2021-02-17 ENCOUNTER — Other Ambulatory Visit: Payer: Self-pay

## 2021-02-17 ENCOUNTER — Encounter (HOSPITAL_BASED_OUTPATIENT_CLINIC_OR_DEPARTMENT_OTHER): Payer: Self-pay | Admitting: Urology

## 2021-02-17 DIAGNOSIS — J029 Acute pharyngitis, unspecified: Secondary | ICD-10-CM | POA: Insufficient documentation

## 2021-02-17 DIAGNOSIS — R059 Cough, unspecified: Secondary | ICD-10-CM | POA: Diagnosis not present

## 2021-02-17 DIAGNOSIS — Z5321 Procedure and treatment not carried out due to patient leaving prior to being seen by health care provider: Secondary | ICD-10-CM | POA: Insufficient documentation

## 2021-02-17 NOTE — ED Triage Notes (Signed)
Emergency Medicine Provider Triage Evaluation Note  Sara Forbes , a 87 y.o. female  was evaluated in triage.  Pt complains of productive cough, eye redness, burning eye pain bilaterally, sore throat, congestion x 2 day. Had the COVID booster 4 days ago. Pt around sick friend with similar sx. Daughter also recently had strep throat. She was evaluated 2 days ago and placed on prednisone, claritin and flonase with little relief.   Physical Exam  BP (!) 173/82 (BP Location: Left Arm)   Pulse 70   Temp 98.2 F (36.8 C) (Oral)   Ht 5\' 4"  (1.626 m)   Wt 63.5 kg   SpO2 94%   BMI 24.03 kg/m  Gen:   Awake, no distress   HEENT:  Atraumatic; injected sclera bilaterally; mild erythema in posterior oropharynx.  Resp:  Normal effort  Cardiac:  Normal rate  Abd:   Nondistended, nontender  MSK:   Moves extremities without difficulty  Neuro:  Speech clear   Medical Decision Making  Medically screening exam initiated at 5:35 PM.  Appropriate orders placed.  Yolanda T Albright was informed that the remainder of the evaluation will be completed by another provider, this initial triage assessment does not replace that evaluation, and the importance of remaining in the ED until their evaluation is complete.   Rayna Sexton, PA-C 02/17/21 1751

## 2021-02-17 NOTE — ED Triage Notes (Signed)
Started Wednesday, cold symptoms, cough, sore throat, bilateral eye swelling and drainage. Covid 2 days ago negative

## 2021-06-13 ENCOUNTER — Emergency Department (HOSPITAL_BASED_OUTPATIENT_CLINIC_OR_DEPARTMENT_OTHER)
Admission: EM | Admit: 2021-06-13 | Discharge: 2021-06-13 | Disposition: A | Discharge status: Home / Self Care | Payer: Medicare Other | Attending: Emergency Medicine | Admitting: Emergency Medicine

## 2021-06-13 ENCOUNTER — Other Ambulatory Visit: Payer: Self-pay

## 2021-06-13 ENCOUNTER — Encounter (HOSPITAL_BASED_OUTPATIENT_CLINIC_OR_DEPARTMENT_OTHER): Payer: Self-pay | Admitting: Emergency Medicine

## 2021-06-13 ENCOUNTER — Emergency Department (HOSPITAL_BASED_OUTPATIENT_CLINIC_OR_DEPARTMENT_OTHER): Payer: Medicare Other

## 2021-06-13 DIAGNOSIS — Z8673 Personal history of transient ischemic attack (TIA), and cerebral infarction without residual deficits: Secondary | ICD-10-CM | POA: Diagnosis not present

## 2021-06-13 DIAGNOSIS — Z79899 Other long term (current) drug therapy: Secondary | ICD-10-CM | POA: Diagnosis not present

## 2021-06-13 DIAGNOSIS — Z20822 Contact with and (suspected) exposure to covid-19: Secondary | ICD-10-CM | POA: Insufficient documentation

## 2021-06-13 DIAGNOSIS — R112 Nausea with vomiting, unspecified: Secondary | ICD-10-CM | POA: Diagnosis not present

## 2021-06-13 DIAGNOSIS — M545 Low back pain, unspecified: Secondary | ICD-10-CM | POA: Insufficient documentation

## 2021-06-13 DIAGNOSIS — Z853 Personal history of malignant neoplasm of breast: Secondary | ICD-10-CM | POA: Diagnosis not present

## 2021-06-13 DIAGNOSIS — M25551 Pain in right hip: Secondary | ICD-10-CM | POA: Diagnosis not present

## 2021-06-13 LAB — COMPREHENSIVE METABOLIC PANEL
ALT: 19 U/L (ref 0–44)
AST: 24 U/L (ref 15–41)
Albumin: 4.2 g/dL (ref 3.5–5.0)
Alkaline Phosphatase: 95 U/L (ref 38–126)
Anion gap: 11 (ref 5–15)
BUN: 18 mg/dL (ref 8–23)
CO2: 27 mmol/L (ref 22–32)
Calcium: 9.5 mg/dL (ref 8.9–10.3)
Chloride: 98 mmol/L (ref 98–111)
Creatinine, Ser: 0.97 mg/dL (ref 0.44–1.00)
GFR, Estimated: 57 mL/min — ABNORMAL LOW (ref >60)
Glucose, Bld: 127 mg/dL — ABNORMAL HIGH (ref 70–99)
Potassium: 3.7 mmol/L (ref 3.5–5.1)
Sodium: 136 mmol/L (ref 135–145)
Total Bilirubin: 0.7 mg/dL (ref 0.3–1.2)
Total Protein: 7 g/dL (ref 6.5–8.1)

## 2021-06-13 LAB — CBC WITH DIFFERENTIAL/PLATELET
Abs Immature Granulocytes: 0.03 10*3/uL (ref 0.00–0.07)
Basophils Absolute: 0.1 10*3/uL (ref 0.0–0.1)
Basophils Relative: 1 %
Eosinophils Absolute: 0 10*3/uL (ref 0.0–0.5)
Eosinophils Relative: 0 %
HCT: 40.3 % (ref 36.0–46.0)
Hemoglobin: 14 g/dL (ref 12.0–15.0)
Immature Granulocytes: 1 %
Lymphocytes Relative: 14 %
Lymphs Abs: 0.9 10*3/uL (ref 0.7–4.0)
MCH: 34.1 pg — ABNORMAL HIGH (ref 26.0–34.0)
MCHC: 34.7 g/dL (ref 30.0–36.0)
MCV: 98.1 fL (ref 80.0–100.0)
Monocytes Absolute: 0.4 10*3/uL (ref 0.1–1.0)
Monocytes Relative: 7 %
Neutro Abs: 5.1 10*3/uL (ref 1.7–7.7)
Neutrophils Relative %: 77 %
Platelets: 198 10*3/uL (ref 150–400)
RBC: 4.11 MIL/uL (ref 3.87–5.11)
RDW: 12.7 % (ref 11.5–15.5)
WBC: 6.5 10*3/uL (ref 4.0–10.5)
nRBC: 0 % (ref 0.0–0.2)

## 2021-06-13 LAB — URINALYSIS, ROUTINE W REFLEX MICROSCOPIC
Bilirubin Urine: NEGATIVE
Glucose, UA: NEGATIVE mg/dL
Hgb urine dipstick: NEGATIVE
Ketones, ur: NEGATIVE mg/dL
Leukocytes,Ua: NEGATIVE
Nitrite: NEGATIVE
Protein, ur: NEGATIVE mg/dL
Specific Gravity, Urine: 1.015 (ref 1.005–1.030)
pH: 8 (ref 5.0–8.0)

## 2021-06-13 LAB — RESP PANEL BY RT-PCR (FLU A&B, COVID) ARPGX2
Influenza A by PCR: NEGATIVE
Influenza B by PCR: NEGATIVE
SARS Coronavirus 2 by RT PCR: NEGATIVE

## 2021-06-13 MED ORDER — PREDNISONE 10 MG PO TABS: 40.0000 mg | ORAL_TABLET | Freq: Every day | ORAL | 0 refills | Status: AC

## 2021-06-13 MED ORDER — FENTANYL CITRATE (PF) 100 MCG/2ML IJ SOLN
50.0000 ug | Freq: Once | INTRAMUSCULAR | Status: AC
Start: 2021-06-13 — End: 2021-06-13
  Administered 2021-06-13: 50 ug via INTRAVENOUS
  Filled 2021-06-13: qty 2

## 2021-06-13 MED ORDER — CYCLOBENZAPRINE HCL 5 MG PO TABS: 5.0000 mg | ORAL_TABLET | Freq: Two times a day (BID) | ORAL | 0 refills | Status: AC | PRN

## 2021-06-13 MED ORDER — LACTATED RINGERS IV BOLUS
1000.0000 mL | Freq: Once | INTRAVENOUS | Status: AC
  Administered 2021-06-13: 1000 mL via INTRAVENOUS

## 2021-06-13 MED ORDER — ONDANSETRON HCL 4 MG/2ML IJ SOLN
4.0000 mg | Freq: Once | INTRAMUSCULAR | Status: AC
  Administered 2021-06-13: 4 mg via INTRAVENOUS
  Filled 2021-06-13: qty 2

## 2021-06-13 NOTE — ED Notes (Signed)
RESTRICTED EXTREMITY BRACELET PLACED ON RUE DUE TO MASTECTOMY

## 2021-06-13 NOTE — ED Triage Notes (Signed)
Right hip pain started 8/17. Hx of sciatica, but normally located on left. Nausea and vomiting 8/17 after bbq dinner.

## 2021-06-13 NOTE — ED Provider Notes (Signed)
[facility]ERG[facility] DEPARTMENT Provider Note   CSN: [id number] Arrival date & time: 06/13/21  [id number]     History Chief Complaint  Patient presents with   Back Pain    Sara Forbes is a 87 y.o. female.  HPI     87 year old female with a history of hyperlipidemia, depression, breast cancer, CVA who presents with concern for lower right back pain, nausea and vomiting.   Last week developed worsening left sided sciatica pain, saw provider then  Yesterday pain started right hip radiating some, ate dinner, had BBQ and after that was vomiting, vomited several times until there was nothing left, they came and checked on her, BP increased and thought maybe from the pain around 10 or 11PM, 630AM called daughter (retired NP). Took BP medication this AM, is usually well controlled.   Right sided pain, nothing helps, not worse with walking, could not sleep, worse with movement but is bad even with not moving No abdominal pain Constant pain Normal stool, takes senna nightly, no black or bloody stools Still having some nausea, feels dry No chest pain or dyspnea, no headache Vertigo on and off all the time, turning over in bed  Pain 8/10 now No hx of back surgeries No hx of injury or fall  No known sick contacts  Past Medical History:  Diagnosis Date   Allergy    hayfever   Breast cancer ([place])    Cancer ([place])    CIS right breast   Depression    chronic; never hospitalized, no h/o suicidal ideatino   History of migraines    not a current problem   Hyperlipidemia    Hyperparathyroidism    Stroke [facility])    '08 - affected vision - total recovery   Thyroid disease    Varicella w/o complication     Patient Active Problem List   Diagnosis Date Noted   Osteopenia 01/05/2012   Sciatica of left side 05/20/2011   Depression    Hyperlipidemia    Thyroid disease     Past Surgical History:  Procedure Laterality Date   ABDOMINAL HYSTERECTOMY     fibroids 1978    BREAST BIOPSY Left 2017   BREAST SURGERY     right mastectomy with reconstruction 1985   bunionectomy  2000   bilateral ([facility])   [place] Right    parathyroidectomy  1987   [facility]   SKIN CANCER DESTRUCTION  2008, 2012   face, scalp for squamous cell carcinoma     OB History   No obstetric history on file.     Family History  Problem Relation Age of Onset   Arthritis Mother    Hypertension Mother    Heart disease Father        CAD/MI   Hyperlipidemia Father    Hypertension Father    Cancer Sister        lung cancer   Cancer Sister        breast cancer   Diabetes Neg Hx     Social History   Tobacco Use   Smoking status: Never   Smokeless tobacco: Never  Substance Use Topics   Alcohol use: Yes    Alcohol/week: 1.0 standard drink    Types: 1 Glasses of wine per week    Comment: socially    Drug use: No    Home Medications Prior to Admission medications   Medication Sig Start Date End Date Taking?  Authorizing Provider  cyclobenzaprine (FLEXERIL) 5 MG tablet Take 1-2 tablets (5-10 mg total) by mouth 2 (two) times daily as needed for muscle spasms. 06/13/21  Yes Gareth Morgan, MD  predniSONE (DELTASONE) 10 MG tablet Take 4 tablets (40 mg total) by mouth daily for 5 days. 06/13/21 06/18/21 Yes Gareth Morgan, MD  atenolol (TENORMIN) 50 MG tablet TAKE ONE (1) TABLET BY MOUTH TWO (2) TIMES DAILY 06/06/13   Norins, Heinz Knuckles, MD  CELEBREX 200 MG capsule TAKE ONE (1) CAPSULE BY MOUTH 2 TIMES   DAILY 10/15/12   Norins, Heinz Knuckles, MD  ibuprofen (ADVIL,MOTRIN) 100 MG tablet Take 100 mg by mouth 2 (two) times daily.      [provider]  levothyroxine (SYNTHROID, LEVOTHROID) 88 MCG tablet TAKE ONE (1) TABLET EACH DAY 05/09/13   Norins, Heinz Knuckles, MD  MICARDIS HCT 80-12.5 MG per tablet TAKE ONE (1) TABLET EACH DAY 06/07/12   Norins, Heinz Knuckles, MD  MULTIPLE VITAMIN PO Take by mouth. Centrum Silver.    [provider]  senna  (SENOKOT) 8.6 MG tablet Take 1 tablet by mouth at bedtime.      [provider]  sertraline (ZOLOFT) 50 MG tablet TAKE ONE (1) TABLET EACH DAY 06/07/12   Norins, Heinz Knuckles, MD  simvastatin (ZOCOR) 40 MG tablet TAKE 1/2 TABLET AT BEDTIME 11/01/13   Norins, Heinz Knuckles, MD  tobramycin-dexamethasone [facility]) ophthalmic solution Place 1 drop into the right eye 4 (four) times daily.    [provider]    Allergies    Patient has no known allergies.  Review of Systems   Review of Systems  Constitutional:  Negative for fever.  HENT:  Negative for sore throat.   Eyes:  Negative for visual disturbance.  Respiratory:  Negative for cough and shortness of breath.   Cardiovascular:  Negative for chest pain.  Gastrointestinal:  Positive for nausea and vomiting. Negative for abdominal pain, constipation and diarrhea (went 3 times yesterday but not diarrhea).  Genitourinary:  Negative for difficulty urinating.  Musculoskeletal:  Positive for arthralgias and back pain. Negative for neck pain.  Skin:  Negative for rash.  Neurological:  Negative for syncope and headaches.   Physical Exam Updated Vital Signs BP (!) 146/122   Pulse 67   Temp 98.5 F (36.9 C) (Oral)   Resp (!) 21   Ht '5\' 4"'$  (1.626 m)   Wt 63.5 kg   SpO2 99%   BMI 24.03 kg/m   Physical Exam Vitals and nursing note reviewed.  Constitutional:      General: She is not in acute distress.    Appearance: She is well-developed. She is not diaphoretic.  HENT:     Head: Normocephalic and atraumatic.  Eyes:     Conjunctiva/sclera: Conjunctivae normal.  Cardiovascular:     Rate and Rhythm: Normal rate and regular rhythm.  Pulmonary:     Effort: Pulmonary effort is normal. No respiratory distress.  Abdominal:     General: There is no distension.     Palpations: Abdomen is soft.     Tenderness: There is no abdominal tenderness. There is no guarding.  Musculoskeletal:        General: No tenderness.     Cervical back:  Normal range of motion.  Skin:    General: Skin is warm and dry.     Findings: No erythema or rash.  Neurological:     Mental Status: She is alert and oriented to person, place, and time.  ED Results / Procedures / Treatments   Labs (all labs ordered are listed, but only abnormal results are displayed) Labs Reviewed  CBC WITH DIFFERENTIAL/PLATELET - Abnormal; Notable for the following components:      Result Value   MCH 34.1 (*)    All other components within normal limits  COMPREHENSIVE METABOLIC PANEL - Abnormal; Notable for the following components:   Glucose, Bld 127 (*)    GFR, Estimated 57 (*)    All other components within normal limits  URINALYSIS, ROUTINE W REFLEX MICROSCOPIC - Abnormal; Notable for the following components:   APPearance HAZY (*)    All other components within normal limits  RESP PANEL BY RT-PCR (FLU A&B, COVID) [id number]  URINE CULTURE    EKG EKG Interpretation  Date/Time:  Thursday June 13 2021 09:16:01 EDT Ventricular Rate:  63 PR Interval:  160 QRS Duration: 91 QT Interval:  451 QTC Calculation: 462 R Axis:   -2 Text Interpretation: Sinus rhythm No significant change since last tracing Confirmed by Gareth Morgan [phone]) on 06/13/2021 9:25:36 AM  Radiology DG Lumbar Spine Complete  Result Date: 06/13/2021 CLINICAL DATA:  Left-sided pain radiating to the right hip EXAM: LUMBAR SPINE - COMPLETE 4+ VIEW COMPARISON:  None. FINDINGS: There are 5 non-rib-bearing lumbar type vertebral bodies. There is levoscoliosis centered at L3-L4 with slight right lateral listhesis of L2 on L3 and left lateral listhesis of L4 on L5. Sagittal alignment is within normal limits, without anterior or retrolisthesis. There is no evidence of spondylolysis. There is marked intervertebral disc space narrowing and vacuum disc phenomenon at L2-L3 through L4-L5. There are associated degenerative endplate changes. There is facet arthropathy most advanced at L4-L5 and L5-S1.  There is calcified atherosclerotic plaque of the aortic arch. The soft tissues are otherwise unremarkable. IMPRESSION: 1. No acute findings. 2. Multilevel degenerative changes as above. Electronically Signed   By: Valetta Mole M.D.   On: 06/13/2021 10:33   DG Hip Unilat W or Wo Pelvis 2-3 Views Right  Result Date: 06/13/2021 CLINICAL DATA:  Right hip pain starting 06/12/2021. EXAM: DG HIP (WITH OR WITHOUT PELVIS) 2-3V RIGHT COMPARISON:  None. FINDINGS: No hip fracture, subluxation, or joint space narrowing. Lower lumbar spine degeneration with leftward L4-5 listhesis, there is pending radiography of the lumbar spine. Chondrocalcinosis at the symphysis pubis. IMPRESSION: No acute or focal finding. Electronically Signed   By: Monte Fantasia M.D.   On: 06/13/2021 10:19    Procedures Procedures   Medications Ordered in ED Medications  ondansetron (ZOFRAN) injection 4 mg (4 mg Intravenous Given 06/13/21 0920)  lactated ringers bolus 1,000 mL (0 mLs Intravenous Stopped 06/13/21 1133)  fentaNYL (SUBLIMAZE) injection 50 mcg (50 mcg Intravenous Given 06/13/21 0920)    ED Course  I have reviewed the triage vital signs and the nursing notes.  Pertinent labs & imaging results that were available during my care of the patient were reviewed by me and considered in my medical decision making (see chart for details).    MDM Rules/Calculators/A&P                            87 year old female with a history of hyperlipidemia, depression, breast cancer, CVA who presents with concern for lower right back pain, nausea and vomiting.   She denies any abdominal pain, has a benign abdominal exam, denies continuing vomiting, have low clinical suspicion for small bowel obstruction, diverticulitis, appendicitis, cholecystitis, cholangitis, pancreatitis, AAA.  She  has good pulses bilaterally, and have low suspicion for aortic dissection or acute arterial occlusion.  The pain is worse with movement and is more  consistent with musculoskeletal etiology of pain, and quality and history are not consistent with nephrolithiasis.  Urinalysis returned showing no signs of urinary tract infection and was sent for culture given combination of symptoms.  No leukocytosis, no kidney injury, and no elevation in liver numbers.  X-rays of the lumbar spine and hip show no acute findings, but multilevel degenerative changes.  She does not have any weakness, numbness, loss of control of bowel or bladder, and history is not consistent with epidural abscess, osteomyelitis, cauda equina.  Unclear if emesis was related to food/severe reflux. EKG without changes, no cp or dyspnea, no headache to suggest ACS,ICH as etiology of emesis.  Possibly related to pain and improved now.  Suspect likely likely musculoskeletal etiology of pain, consider degenerative disc disease and radicular pain.  Has history of similar with response to steroids in the past on the left.  Will give short course of steroids, and prescription for muscle relaxant for pain and recommend outpatient follow-up.      Final Clinical Impression(s) / ED Diagnoses Final diagnoses:  Acute right-sided low back pain, unspecified whether sciatica present  Non-intractable vomiting with nausea, unspecified vomiting type    Rx / DC Orders ED Discharge Orders          Ordered    predniSONE (DELTASONE) 10 MG tablet  Daily        06/13/21 1059    cyclobenzaprine (FLEXERIL) 5 MG tablet  2 times daily PRN        06/13/21 1059             Gareth Morgan, MD 06/14/21 [phone]

## 2021-06-13 NOTE — ED Notes (Signed)
Medicated per ED MD orders, safety measures in place, sr x 2 up, call bell within reach, bed in lowest position, family at bedside, pt instructed not to get up without staff in room

## 2021-06-14 LAB — URINE CULTURE: Culture: <10000 — AB

## 2021-11-04 ENCOUNTER — Other Ambulatory Visit: Payer: Self-pay | Admitting: Nurse Practitioner

## 2021-11-04 DIAGNOSIS — Z1231 Encounter for screening mammogram for malignant neoplasm of breast: Secondary | ICD-10-CM

## 2021-12-13 ENCOUNTER — Ambulatory Visit: Payer: Medicare Other

## 2021-12-27 ENCOUNTER — Other Ambulatory Visit: Payer: Self-pay

## 2021-12-27 ENCOUNTER — Ambulatory Visit
Admission: RE | Admit: 2021-12-27 | Discharge: 2021-12-27 | Disposition: A | Discharge status: Home / Self Care | Payer: Medicare Other | Source: Ambulatory Visit | Attending: Nurse Practitioner | Admitting: Nurse Practitioner

## 2021-12-27 DIAGNOSIS — Z1231 Encounter for screening mammogram for malignant neoplasm of breast: Secondary | ICD-10-CM

## 2022-12-16 ENCOUNTER — Other Ambulatory Visit: Payer: Self-pay

## 2022-12-16 DIAGNOSIS — Z1231 Encounter for screening mammogram for malignant neoplasm of breast: Secondary | ICD-10-CM

## 2023-01-30 ENCOUNTER — Other Ambulatory Visit: Payer: Self-pay | Admitting: Nurse Practitioner

## 2023-01-30 ENCOUNTER — Ambulatory Visit
Admission: RE | Admit: 2023-01-30 | Discharge: 2023-01-30 | Disposition: A | Discharge status: Home / Self Care | Payer: Medicare Other | Source: Ambulatory Visit | Attending: Nurse Practitioner | Admitting: Nurse Practitioner

## 2023-01-30 DIAGNOSIS — Z1231 Encounter for screening mammogram for malignant neoplasm of breast: Secondary | ICD-10-CM

## 2024-01-08 ENCOUNTER — Other Ambulatory Visit: Payer: Self-pay | Admitting: Nurse Practitioner

## 2024-01-08 DIAGNOSIS — Z1231 Encounter for screening mammogram for malignant neoplasm of breast: Secondary | ICD-10-CM

## 2024-02-03 ENCOUNTER — Ambulatory Visit
Admission: RE | Admit: 2024-02-03 | Discharge: 2024-02-03 | Disposition: A | Discharge status: Home / Self Care | Source: Ambulatory Visit | Attending: Nurse Practitioner | Admitting: Nurse Practitioner

## 2024-02-03 DIAGNOSIS — Z1231 Encounter for screening mammogram for malignant neoplasm of breast: Secondary | ICD-10-CM
